# Patient Record
Sex: Female | Born: 2000 | Race: Black or African American | Hispanic: No | Marital: Married | Smoking: Never smoker
Health system: Southern US, Community
[De-identification: ages and names within clinical notes are randomized; demographics above are authoritative.]

## PROBLEM LIST (undated history)

## (undated) DIAGNOSIS — K219 Gastro-esophageal reflux disease without esophagitis: Secondary | ICD-10-CM

## (undated) HISTORY — DX: Gastro-esophageal reflux disease without esophagitis: K21.9

---
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
## 2020-11-24 ENCOUNTER — Ambulatory Visit (HOSPITAL_COMMUNITY)
Admission: EM | Admit: 2020-11-24 | Discharge: 2020-11-24 | Disposition: A | Discharge status: Home / Self Care | Payer: Self-pay | Attending: Family Medicine | Admitting: Family Medicine

## 2020-11-24 ENCOUNTER — Other Ambulatory Visit: Payer: Self-pay

## 2020-11-24 ENCOUNTER — Encounter (HOSPITAL_COMMUNITY): Payer: Self-pay | Admitting: Emergency Medicine

## 2020-11-24 DIAGNOSIS — R1084 Generalized abdominal pain: Secondary | ICD-10-CM

## 2020-11-24 LAB — POCT URINALYSIS DIPSTICK, ED / UC
Bilirubin Urine: NEGATIVE
Glucose, UA: NEGATIVE mg/dL
Ketones, ur: NEGATIVE mg/dL
Leukocytes,Ua: NEGATIVE
Nitrite: NEGATIVE
Protein, ur: 30 mg/dL — AB
Specific Gravity, Urine: 1.025 (ref 1.005–1.030)
Urobilinogen, UA: 0.2 mg/dL (ref 0.0–1.0)
pH: 6 (ref 5.0–8.0)

## 2020-11-24 LAB — POC URINE PREG, ED: Preg Test, Ur: NEGATIVE

## 2020-11-24 MED ORDER — PANTOPRAZOLE SODIUM 40 MG PO TBEC: 40.0000 mg | DELAYED_RELEASE_TABLET | Freq: Every day | ORAL | 0 refills | Status: DC

## 2020-11-24 NOTE — Discharge Instructions (Addendum)

## 2020-11-24 NOTE — ED Triage Notes (Signed)
Lower abdominal pain for 2 weeks.  Eating causes pain.  No vomiting, no diarrhea.  Complains of bloating and had BM on Thursday-soft stool.  Denies urinary symptoms.  Denies vaginal discharge

## 2020-11-26 NOTE — ED Provider Notes (Signed)
[name] CARE CENTER   [id number] 11/24/20 Arrival Time: 1226  ASSESSMENT & PLAN:  1. Generalized abdominal pain    Benign abdominal exam. Suspect GERD etiology. Possible mild constipation also. Given samples of Miralax. No indications for urgent abdominal/pelvic imaging at this time. Discussed.  Begin trial of: Meds ordered this encounter  Medications   pantoprazole (PROTONIX) 40 MG tablet    Sig: Take 1 tablet (40 mg total) by mouth daily.    Dispense:  30 tablet    Refill:  0     Discharge Instructions      You have been seen today for abdominal pain. Your evaluation was not suggestive of any emergent condition requiring medical intervention at this time. However, some abdominal problems make take more time to appear. Therefore, it is very important for you to pay attention to any new symptoms or worsening of your current condition.  Please return here or to the Emergency Department immediately should you begin to feel worse in any way or have any of the following symptoms: increasing or different abdominal pain, persistent vomiting, inability to drink fluids, fevers, or shaking chills.       Follow-up Information     [facility] EMERGENCY DEPARTMENT.   Specialty: Emergency Medicine Why: If symptoms worsen in any way. Contact information: [address] [id number] mc [place] [address] [phone]               Reviewed expectations re: course of current medical issues. Questions answered. Outlined signs and symptoms indicating need for more acute intervention. Patient verbalized understanding. After Visit Summary given.   SUBJECTIVE: History from: patient. Eileen Palmer is a 20 y.o. female who presents with complaint of mostly epigastric abdominal discomfort over past two weeks. Worse after eating. Normal PO intake without n/v/d. Symptoms do not limit daily activities nor wake her from sleep. Feels bloated at  times. More recently has noted lower abdominal discomfort but relates this to moving her bowel less frequently than usual. BMs all non-bloody. Normal urinary habits. No tx PTA. Patient's last menstrual period was 11/04/2020. Regular.  OBJECTIVE:  Vitals:   11/24/20 1356  BP: 127/79  Pulse: 71  Resp: 20  Temp: 98.5 F (36.9 C)  TempSrc: Oral  SpO2: 100%    General appearance: alert, oriented, no acute distress HEENT: [place]; AT; oropharynx moist Lungs: unlabored respirations Abdomen: soft; without distention; no specific tenderness to palpation; normal bowel sounds; without masses or organomegaly; without guarding or rebound tenderness Back: without reported CVA tenderness; FROM at waist Extremities: without LE edema; symmetrical; without gross deformities Skin: warm and dry Neurologic: normal gait Psychological: alert and cooperative; normal mood and affect  Labs: Results for orders placed or performed during the hospital encounter of 11/24/20  POC Urinalysis dipstick  Result Value Ref Range   Glucose, UA NEGATIVE NEGATIVE mg/dL   Bilirubin Urine NEGATIVE NEGATIVE   Ketones, ur NEGATIVE NEGATIVE mg/dL   Specific Gravity, Urine 1.025 1.005 - 1.030   Hgb urine dipstick TRACE (A) NEGATIVE   pH 6.0 5.0 - 8.0   Protein, ur 30 (A) NEGATIVE mg/dL   Urobilinogen, UA 0.2 0.0 - 1.0 mg/dL   Nitrite NEGATIVE NEGATIVE   Leukocytes,Ua NEGATIVE NEGATIVE  POC urine pregnancy  Result Value Ref Range   Preg Test, Ur NEGATIVE NEGATIVE   Labs Reviewed  POCT URINALYSIS DIPSTICK, ED / UC - Abnormal; Notable for the following components:      Result Value   Hgb urine dipstick  TRACE (*)    Protein, ur 30 (*)    All other components within normal limits  POC URINE PREG, ED    No Known Allergies                                             History reviewed. No pertinent past medical history.  Social History   Socioeconomic History   Marital status: Single    Spouse name: Not on file    Number of children: Not on file   Years of education: Not on file   Highest education level: Not on file  Occupational History   Not on file  Tobacco Use   Smoking status: Never   Smokeless tobacco: Never  Vaping Use   Vaping Use: Never used  Substance and Sexual Activity   Alcohol use: No   Drug use: Never   Sexual activity: Not on file  Other Topics Concern   Not on file  Social History Narrative   Not on file   Social Determinants of Health   Financial Resource Strain: Not on file  Food Insecurity: Not on file  Transportation Needs: Not on file  Physical Activity: Not on file  Stress: Not on file  Social Connections: Not on file  Intimate Partner Violence: Not on file    Family History  Problem Relation Age of Onset   Hyperlipidemia Mother    Diabetes Mother    Healthy Father      Mardella Layman, MD 11/26/20 1113

## 2021-05-29 ENCOUNTER — Ambulatory Visit (HOSPITAL_COMMUNITY)
Admission: EM | Admit: 2021-05-29 | Discharge: 2021-05-29 | Disposition: A | Discharge status: Home / Self Care | Payer: BC Managed Care – PPO | Attending: Physician Assistant | Admitting: Physician Assistant

## 2021-05-29 ENCOUNTER — Encounter (HOSPITAL_COMMUNITY): Payer: Self-pay

## 2021-05-29 ENCOUNTER — Other Ambulatory Visit: Payer: Self-pay

## 2021-05-29 ENCOUNTER — Ambulatory Visit (INDEPENDENT_AMBULATORY_CARE_PROVIDER_SITE_OTHER): Payer: BC Managed Care – PPO

## 2021-05-29 DIAGNOSIS — R051 Acute cough: Secondary | ICD-10-CM | POA: Insufficient documentation

## 2021-05-29 DIAGNOSIS — R059 Cough, unspecified: Secondary | ICD-10-CM

## 2021-05-29 DIAGNOSIS — K219 Gastro-esophageal reflux disease without esophagitis: Secondary | ICD-10-CM | POA: Diagnosis not present

## 2021-05-29 DIAGNOSIS — R1013 Epigastric pain: Secondary | ICD-10-CM | POA: Insufficient documentation

## 2021-05-29 LAB — CBC
HCT: 38.9 % (ref 36.0–46.0)
Hemoglobin: 12.6 g/dL (ref 12.0–15.0)
MCH: 28.8 pg (ref 26.0–34.0)
MCHC: 32.4 g/dL (ref 30.0–36.0)
MCV: 89 fL (ref 80.0–100.0)
Platelets: 273 10*3/uL (ref 150–400)
RBC: 4.37 MIL/uL (ref 3.87–5.11)
RDW: 12 % (ref 11.5–15.5)
WBC: 4.1 10*3/uL (ref 4.0–10.5)
nRBC: 0 % (ref 0.0–0.2)

## 2021-05-29 LAB — COMPREHENSIVE METABOLIC PANEL
ALT: 20 U/L (ref 0–44)
AST: 19 U/L (ref 15–41)
Albumin: 3.5 g/dL (ref 3.5–5.0)
Alkaline Phosphatase: 78 U/L (ref 38–126)
Anion gap: 7 (ref 5–15)
BUN: 6 mg/dL (ref 6–20)
CO2: 23 mmol/L (ref 22–32)
Calcium: 8.6 mg/dL — ABNORMAL LOW (ref 8.9–10.3)
Chloride: 105 mmol/L (ref 98–111)
Creatinine, Ser: 0.7 mg/dL (ref 0.44–1.00)
GFR, Estimated: >60 mL/min (ref >60)
Glucose, Bld: 80 mg/dL (ref 70–99)
Potassium: 4 mmol/L (ref 3.5–5.1)
Sodium: 135 mmol/L (ref 135–145)
Total Bilirubin: 0.2 mg/dL — ABNORMAL LOW (ref 0.3–1.2)
Total Protein: 6.6 g/dL (ref 6.5–8.1)

## 2021-05-29 LAB — LIPASE, BLOOD: Lipase: 33 U/L (ref 11–51)

## 2021-05-29 MED ORDER — SUCRALFATE 1 G PO TABS: 1.0000 g | ORAL_TABLET | Freq: Three times a day (TID) | ORAL | 0 refills | Status: DC

## 2021-05-29 MED ORDER — ALUM & MAG HYDROXIDE-SIMETH 200-200-20 MG/5ML PO SUSP
30.0000 mL | Freq: Once | ORAL | Status: AC
  Administered 2021-05-29: 30 mL via ORAL

## 2021-05-29 MED ORDER — PANTOPRAZOLE SODIUM 40 MG PO TBEC: 40.0000 mg | DELAYED_RELEASE_TABLET | Freq: Every day | ORAL | 0 refills | Status: DC

## 2021-05-29 MED ORDER — LIDOCAINE VISCOUS HCL 2 % MT SOLN
15.0000 mL | Freq: Once | OROMUCOSAL | Status: AC
  Administered 2021-05-29: 15 mL via ORAL

## 2021-05-29 MED ORDER — ALUM & MAG HYDROXIDE-SIMETH 200-200-20 MG/5ML PO SUSP
ORAL | Status: AC
  Filled 2021-05-29: qty 30

## 2021-05-29 MED ORDER — LIDOCAINE VISCOUS HCL 2 % MT SOLN
OROMUCOSAL | Status: AC
  Filled 2021-05-29: qty 15

## 2021-05-29 NOTE — ED Provider Notes (Signed)
[place]    CSN: [id number] Arrival date & time: 05/29/21  1608      History   Chief Complaint Chief Complaint  Patient presents with   Abdominal Pain   Cough   Sore Throat    Dry    HPI Eileen Palmer is a 21 y.o. female.   Patient presents today with a prolonged history of intermittent upper abdominal pain that has worsened recently.  She was seen by our clinic for similar symptoms August 2022 at which point she was started on Protonix which did not provide any relief of symptoms.  She reports during episodes pain is rated 10 on a 0-10 pain scale, localized to upper abdomen, described as burning, no alleviating factors identified.  Patient does report symptoms are worse after meals.  She has not tried over-the-counter medication for symptom management.  She has not seen a GI specialist in the past.  She rarely drinks alcohol and denies ongoing NSAID use.  She is confident that she is not pregnant as she recently had her menstrual cycle; LMP 05/08/2021.  She does report that recently she has had worsening cough with blood-streaked sputum.  She denies any fever, chest pain, shortness of breath, nasal congestion, other URI symptoms.  She denies any dysphagia, melena, hematochezia, frank hemoptysis, nausea/vomiting.   History reviewed. No pertinent past medical history.  There are no problems to display for this patient.   History reviewed. No pertinent surgical history.  OB History   No obstetric history on file.      Home Medications    Prior to Admission medications   Medication Sig Start Date End Date Taking? Authorizing Provider  sucralfate (CARAFATE) 1 g tablet Take 1 tablet (1 g total) by mouth 4 (four) times daily -  with meals and at bedtime. 05/29/21  Yes Dyland Panuco K, PA-C  pantoprazole (PROTONIX) 40 MG tablet Take 1 tablet (40 mg total) by mouth daily. 05/29/21   Ayssa Bentivegna, Derry Skill, PA-C    Family History Family History  Problem Relation Age of Onset    Hyperlipidemia Mother    Diabetes Mother    Healthy Father     Social History Social History   Tobacco Use   Smoking status: Never   Smokeless tobacco: Never  Vaping Use   Vaping Use: Never used  Substance Use Topics   Alcohol use: No   Drug use: Never     Allergies   Patient has no known allergies.   Review of Systems Review of Systems  Constitutional:  Positive for activity change. Negative for appetite change, fatigue and fever.  HENT:  Positive for sore throat. Negative for congestion, sinus pressure and sneezing.   Respiratory:  Positive for cough. Negative for shortness of breath.   Cardiovascular:  Negative for chest pain.  Gastrointestinal:  Positive for abdominal pain. Negative for diarrhea, nausea and vomiting.  Neurological:  Negative for dizziness, light-headedness and headaches.    Physical Exam Triage Vital Signs ED Triage Vitals  Enc Vitals Group     BP 05/29/21 1730 125/83     Pulse Rate 05/29/21 1729 69     Resp 05/29/21 1729 16     Temp 05/29/21 1730 98.4 F (36.9 C)     Temp Source 05/29/21 1730 Oral     SpO2 05/29/21 1729 99 %     Weight --      Height --      Head Circumference --      Peak  Flow --      Pain Score 05/29/21 1728 0     Pain Loc --      Pain Edu? --      Excl. in [place]? --    No data found.  Updated Vital Signs BP 125/83 (BP Location: Left Arm)    Pulse 69    Temp 98.4 F (36.9 C) (Oral)    Resp 16    LMP 05/08/2021 (Exact Date)    SpO2 99%   Visual Acuity Right Eye Distance:   Left Eye Distance:   Bilateral Distance:    Right Eye Near:   Left Eye Near:    Bilateral Near:     Physical Exam Vitals reviewed.  Constitutional:      General: She is awake. She is not in acute distress.    Appearance: Normal appearance. She is well-developed. She is not ill-appearing.     Comments: Very pleasant female appears stated age in no acute distress  HENT:     Head: Normocephalic and atraumatic.     Mouth/Throat:      Mouth: Mucous membranes are moist.     Pharynx: Uvula midline. No oropharyngeal exudate or posterior oropharyngeal erythema.  Cardiovascular:     Rate and Rhythm: Normal rate and regular rhythm.     Heart sounds: Normal heart sounds, S1 normal and S2 normal. No murmur heard. Pulmonary:     Effort: Pulmonary effort is normal.     Breath sounds: Normal breath sounds. No wheezing, rhonchi or rales.     Comments: Clear to auscultation bilaterally Abdominal:     General: Bowel sounds are normal.     Palpations: Abdomen is soft.     Tenderness: There is abdominal tenderness in the epigastric area and left upper quadrant. There is no right CVA tenderness, left CVA tenderness, guarding or rebound.  Musculoskeletal:     Right lower leg: No edema.     Left lower leg: No edema.  Psychiatric:        Behavior: Behavior is cooperative.     UC Treatments / Results  Labs (all labs ordered are listed, but only abnormal results are displayed) Labs Reviewed  CBC  COMPREHENSIVE METABOLIC PANEL  LIPASE, BLOOD    EKG   Radiology DG Chest 2 View  Result Date: 05/29/2021 CLINICAL DATA:  Worsening cough. EXAM: CHEST - 2 VIEW COMPARISON:  11/25/2017 FINDINGS: The heart size and mediastinal contours are within normal limits. Both lungs are clear. The visualized skeletal structures are unremarkable. IMPRESSION: No active cardiopulmonary disease. Electronically Signed   By: Yvonne Kendall M.D.   On: 05/29/2021 18:03    Procedures Procedures (including critical care time)  Medications Ordered in UC Medications  alum & mag hydroxide-simeth (MAALOX/MYLANTA) 200-200-20 MG/5ML suspension 30 mL (30 mLs Oral Given 05/29/21 1809)    And  lidocaine (XYLOCAINE) 2 % viscous mouth solution 15 mL (15 mLs Oral Given 05/29/21 1809)    Initial Impression / Assessment and Plan / UC Course  I have reviewed the triage vital signs and the nursing notes.  Pertinent labs & imaging results that were available during my  care of the patient were reviewed by me and considered in my medical decision making (see chart for details).     Vital signs and physical exam reassuring today; no indication for emergent evaluation or imaging.  X-ray was obtained given chronic cough which showed no active cardiopulmonary disease.  Discussed that sore throat and cough symptoms are likely related  to uncontrolled GERD.  Patient had epigastric pain on exam so lipase, CBC, CMP were obtained today-results pending.  She was given GI cocktail in clinic with improvement.  Given persistent symptoms concern for ulcer disease versus H. pylori.  Discussed that this would need to be investigated by GI specialist and she was given contact information for local provider with instruction to call to schedule an appointment as soon as possible.  She was started on Protonix and Carafate for symptom management.  Recommended dietary and lifestyle modification for additional symptom relief.  Discussed that if symptoms worsen in any way and she has severe abdominal pain, fever, nausea/vomiting, melena/hematochezia, hematemesis she needs to go to the emergency room.  Strict return precautions given to which she expressed understanding.  Work excuse note provided.  Final Clinical Impressions(s) / UC Diagnoses   Final diagnoses:  Abdominal pain, epigastric  Acute cough  Gastroesophageal reflux disease, unspecified whether esophagitis present     Discharge Instructions      I will contact you if your lab work is abnormal.  Please start Protonix as previously prescribed.  This should be taken on empty stomach entheses 30 minutes before meal or 2 hours after).  Take Carafate before each meal and before bed to coat your stomach.  Avoid alcohol.  Avoid NSAIDs including aspirin, ibuprofen/Advil, naproxen/Aleve.  If your symptoms not improving quickly please follow-up with stomach specialist.  If develop any severe symptoms including severe abdominal pain,  nausea/vomiting interfering with oral intake, blood in your stool, blood in your vomit you need to go to the emergency room.     ED Prescriptions     Medication Sig Dispense Auth. Provider   pantoprazole (PROTONIX) 40 MG tablet Take 1 tablet (40 mg total) by mouth daily. 30 tablet Escarlet Saathoff K, PA-C   sucralfate (CARAFATE) 1 g tablet Take 1 tablet (1 g total) by mouth 4 (four) times daily -  with meals and at bedtime. 28 tablet Sylvan Lahm, Derry Skill, PA-C      PDMP not reviewed this encounter.   Terrilee Croak, PA-C 05/29/21 1825

## 2021-05-29 NOTE — Discharge Instructions (Signed)
I will contact you if your lab work is abnormal.  Please start Protonix as previously prescribed.  This should be taken on empty stomach entheses 30 minutes before meal or 2 hours after).  Take Carafate before each meal and before bed to coat your stomach.  Avoid alcohol.  Avoid NSAIDs including aspirin, ibuprofen/Advil, naproxen/Aleve.  If your symptoms not improving quickly please follow-up with stomach specialist.  If develop any severe symptoms including severe abdominal pain, nausea/vomiting interfering with oral intake, blood in your stool, blood in your vomit you need to go to the emergency room.

## 2021-05-29 NOTE — ED Triage Notes (Signed)
Pt presents with c/o stomach pain x 3 days.

## 2021-05-30 ENCOUNTER — Encounter: Payer: Self-pay | Admitting: Nurse Practitioner

## 2021-06-11 ENCOUNTER — Encounter: Payer: Self-pay | Admitting: Nurse Practitioner

## 2021-06-11 ENCOUNTER — Ambulatory Visit (INDEPENDENT_AMBULATORY_CARE_PROVIDER_SITE_OTHER): Payer: BC Managed Care – PPO | Admitting: Nurse Practitioner

## 2021-06-11 ENCOUNTER — Other Ambulatory Visit: Payer: BC Managed Care – PPO

## 2021-06-11 VITALS — BP 102/68 | HR 62 | Ht 64.0 in | Wt 265.2 lb

## 2021-06-11 DIAGNOSIS — R1030 Lower abdominal pain, unspecified: Secondary | ICD-10-CM | POA: Diagnosis not present

## 2021-06-11 DIAGNOSIS — K5900 Constipation, unspecified: Secondary | ICD-10-CM | POA: Diagnosis not present

## 2021-06-11 DIAGNOSIS — R1013 Epigastric pain: Secondary | ICD-10-CM | POA: Insufficient documentation

## 2021-06-11 DIAGNOSIS — R1010 Upper abdominal pain, unspecified: Secondary | ICD-10-CM

## 2021-06-11 HISTORY — DX: Epigastric pain: R10.13

## 2021-06-11 HISTORY — DX: Constipation, unspecified: K59.00

## 2021-06-11 MED ORDER — FAMOTIDINE 20 MG PO TABS
20.0000 mg | ORAL_TABLET | Freq: Every day | ORAL | 1 refills | Status: DC
Start: 2021-06-11 — End: 2024-03-04

## 2021-06-11 NOTE — Patient Instructions (Addendum)
RECOMMENDATIONS:  Stop Protonix. Start Famotidine 20 MG once a day after you submit the stool tests. This has been sent to the pharmacy, if your insurance does not cover it, then you may purchase it over the counter. Miralax- Dissolve one capful in 8 ounces of water and drink before bed. We have scheduled you a follow up with Dr. Candis Schatz on 07/30/21 at 11:10am.  Please proceed to the basement level to pick up the stool test kit before leaving today. Press "B" on the elevator. The lab is located at the first door on the left as you exit the elevator.  HEALTHCARE LAWS AND MY CHART RESULTS:   Due to recent changes in healthcare laws, you may see results of your imaging and/or laboratory studies on MyChart before I have had a chance to review them.  I understand that in some cases there may be results that are confusing or concerning to you. Please understand that not all results are received at the same time and often I may need to interpret multiple results in order to provide you with the best plan of care or course of treatment. Therefore, I ask that you please give me 48 hours to thoroughly review all your results before contacting my office for clarification.   Gastroesophageal Reflux Disease, Adult Gastroesophageal reflux (GER) happens when acid from the stomach flows up into the tube that connects the mouth and the stomach (esophagus). Normally, food travels down the esophagus and stays in the stomach to be digested. With GER, food and stomach acid sometimes move back up into the esophagus. You may have a disease called gastroesophageal reflux disease (GERD) if the reflux: Happens often. Causes frequent or very bad symptoms. Causes problems such as damage to the esophagus. When this happens, the esophagus becomes sore and swollen. Over time, GERD can make small holes (ulcers) in the lining of the esophagus. What are the causes? This condition is caused by a problem with the muscle between  the esophagus and the stomach. When this muscle is weak or not normal, it does not close properly to keep food and acid from coming back up from the stomach. The muscle can be weak because of: Tobacco use. Pregnancy. Having a certain type of hernia (hiatal hernia). Alcohol use. Certain foods and drinks, such as coffee, chocolate, onions, and peppermint. What increases the risk? Being overweight. Having a disease that affects your connective tissue. Taking NSAIDs, such a ibuprofen. What are the signs or symptoms? Heartburn. Difficult or painful swallowing. The feeling of having a lump in the throat. A bitter taste in the mouth. Bad breath. Having a lot of saliva. Having an upset or bloated stomach. Burping. Chest pain. Different conditions can cause chest pain. Make sure you see your doctor if you have chest pain. Shortness of breath or wheezing. A long-term cough or a cough at night. Wearing away of the surface of teeth (tooth enamel). Weight loss. How is this treated? Making changes to your diet. Taking medicine. Having surgery. Treatment will depend on how bad your symptoms are. Follow these instructions at home: Eating and drinking  Follow a diet as told by your doctor. You may need to avoid foods and drinks such as: Coffee and tea, with or without caffeine. Drinks that contain alcohol. Energy drinks and sports drinks. Bubbly (carbonated) drinks or sodas. Chocolate and cocoa. Peppermint and mint flavorings. Garlic and onions. Horseradish. Spicy and acidic foods. These include peppers, chili powder, curry powder, vinegar, hot sauces, and BBQ  sauce. Citrus fruit juices and citrus fruits, such as oranges, lemons, and limes. Tomato-based foods. These include red sauce, chili, salsa, and pizza with red sauce. Fried and fatty foods. These include donuts, french fries, potato chips, and high-fat dressings. High-fat meats. These include hot dogs, rib eye steak, sausage, ham,  and bacon. High-fat dairy items, such as whole milk, butter, and cream cheese. Eat small meals often. Avoid eating large meals. Avoid drinking large amounts of liquid with your meals. Avoid eating meals during the 2-3 hours before bedtime. Avoid lying down right after you eat. Do not exercise right after you eat. Lifestyle  Do not smoke or use any products that contain nicotine or tobacco. If you need help quitting, ask your doctor. Try to lower your stress. If you need help doing this, ask your doctor. If you are overweight, lose an amount of weight that is healthy for you. Ask your doctor about a safe weight loss goal. General instructions Pay attention to any changes in your symptoms. Take over-the-counter and prescription medicines only as told by your doctor. Do not take aspirin, ibuprofen, or other NSAIDs unless your doctor says it is okay. Wear loose clothes. Do not wear anything tight around your waist. Raise (elevate) the head of your bed about 6 inches (15 cm). You may need to use a wedge to do this. Avoid bending over if this makes your symptoms worse. Keep all follow-up visits. Contact a doctor if: You have new symptoms. You lose weight and you do not know why. You have trouble swallowing or it hurts to swallow. You have wheezing or a cough that keeps happening. You have a hoarse voice. Your symptoms do not get better with treatment. Get help right away if: You have sudden pain in your arms, neck, jaw, teeth, or back. You suddenly feel sweaty, dizzy, or light-headed. You have chest pain or shortness of breath. You vomit and the vomit is green, yellow, or black, or it looks like blood or coffee grounds. You faint. Your poop (stool) is red, bloody, or black. You cannot swallow, drink, or eat. These symptoms may represent a serious problem that is an emergency. Do not wait to see if the symptoms will go away. Get medical help right away. Call your local emergency services  (911 in the [place] Do not drive yourself to the hospital. Summary If a person has gastroesophageal reflux disease (GERD), food and stomach acid move back up into the esophagus and cause symptoms or problems such as damage to the esophagus. Treatment will depend on how bad your symptoms are. Follow a diet as told by your doctor. Take all medicines only as told by your doctor. This information is not intended to replace advice given to you by your health care provider. Make sure you discuss any questions you have with your health care provider. Document Revised: 10/10/2019 Document Reviewed: 10/10/2019 Elsevier Patient Education  2022 [employer]. Thank you for trusting me with your gastrointestinal care!    Noralyn Pick, CRNP    BMI:  If you are age 74 or older, your body mass index should be between 23-30. Your Body mass index is 45.53 kg/m. If this is out of the aforementioned range listed, please consider follow up with your Primary Care Provider.  If you are age 74 or younger, your body mass index should be between 19-25. Your Body mass index is 45.53 kg/m. If this is out of the aformentioned range listed, please consider follow up with  your Primary Care Provider.   MY CHART:  The [facility] providers would like to encourage you to use [facility] to communicate with providers for non-urgent requests or questions.  Due to long hold times on the telephone, sending your provider a message by [facility] may be a faster and more efficient way to get a response.  Please allow 48 business hours for a response.  Please remember that this is for non-urgent requests.

## 2021-06-11 NOTE — Progress Notes (Signed)
Agree with the assessment and plan as outlined by Colleen Kennedy-Smith, NP.    Azariyah Luhrs E. Sherilynn Dieu, MD [facility]  

## 2021-06-11 NOTE — Progress Notes (Signed)
++    06/11/2021 Rilla Edman [id number] 02-25-2001   CHIEF COMPLAINT: Abdominal pain, constipation  HISTORY OF PRESENT ILLNESS:  Farhiya Vannatter is a 21 year old female with a past medical history of obesity. No past surgical history. She presents to our office today as referred by Verna Czech PA-C for further evaluation regarding intermittent upper and lower abdominal pain. She was initially seen in the urgent care clinic 11/24/2020 due to having upper abdominal pain and constipation for which she was prescribed Protonix 40mg  QD and MiraLAX as needed.  She was seen in the urgent care center 05/29/2021 with upper abdominal pain and a cough.  She reported coughing up mucus with streaks of red blood.  A chest x-ray was negative.  She received a GI cocktail and her symptoms improved.  Her cough and throat soreness were thought to be due to GERD.  Her labs were normal and she was advised to continue Pantoprazole 40 mg daily, Sucralfate 1 g 4 times daily was prescribed and she was referred to our office for further GI evaluation. She describes having mostly lower abdominal pain which occurs after eating, no specific foods. She has upper abdominal pain less frequently. She has intermittent nausea without vomiting. No dysphagia or heartburn.  Her throat soreness abated.  She stopped taking Pantoprazole one week ago because she felt sleep and dizzy after taking it.  She is not taking Carafate.  At this time, she is mostly concerned regarding her constipation issues with associated abdominal bloat.  She reports passing 2-3 small brown formed stools daily.  She does not feel emptied after defecation.  No rectal bleeding or black stools.  She is lactose intolerance therefore she avoids all dairy products.  Infrequent NSAID use.  No alcohol or drug use.   CBC Latest Ref Rng & Units 05/29/2021 01/01/2018  WBC 4.0 - 10.5 K/uL 4.1 3.6  Hemoglobin 12.0 - 15.0 g/dL 12.6 12.9  Hematocrit 36.0 - 46.0 % 38.9 39.4   Platelets 150 - 400 K/uL 273 337    CMP Latest Ref Rng & Units 05/29/2021 01/01/2018  Glucose 70 - 99 mg/dL 80 80  BUN 6 - 20 mg/dL 6 6  Creatinine 0.44 - 1.00 mg/dL 0.70 0.73  Sodium 135 - 145 mmol/L 135 138  Potassium 3.5 - 5.1 mmol/L 4.0 4.5  Chloride 98 - 111 mmol/L 105 100  CO2 22 - 32 mmol/L 23 23  Calcium 8.9 - 10.3 mg/dL 8.6(L) 9.8  Total Protein 6.5 - 8.1 g/dL 6.6 -  Total Bilirubin 0.3 - 1.2 mg/dL 0.2(L) -  Alkaline Phos 38 - 126 U/L 78 -  AST 15 - 41 U/L 19 -  ALT 0 - 44 U/L 20 -  Lipase 33.  Social History: Non-smoker.  No alcohol use.  No drug use.  Family History: Mother with history of diabetes and hyperlipidemia.  Father is healthy. No family history of esophageal, gastric or colon cancer.    Outpatient Encounter Medications as of 06/11/2021  Medication Sig   pantoprazole (PROTONIX) 40 MG tablet Take 1 tablet (40 mg total) by mouth daily.   sucralfate (CARAFATE) 1 g tablet Take 1 tablet (1 g total) by mouth 4 (four) times daily -  with meals and at bedtime.   No facility-administered encounter medications on file as of 06/11/2021.    REVIEW OF SYSTEMS: Gen: + Fatigue. Denies fever, sweats or chills. No weight loss.  CV: Denies chest pain, palpitations or edema. Resp: Denies cough, shortness of breath  of hemoptysis.  GI:See HPI.   GU : Denies urinary burning, blood in urine, increased urinary frequency or incontinence. MS: + Back pain.  Derm: Denies rash, itchiness, skin lesions or unhealing ulcers. Psych: Denies depression, anxiety, memory loss, suicidal ideation and confusion. Heme: Denies bruising, easy bleeding. Neuro:  Denies headaches, dizziness or paresthesias. Endo:  Denies any problems with DM, thyroid or adrenal function.  PHYSICAL EXAM: BP 102/68 (BP Location: Left Arm, Patient Position: Sitting, Cuff Size: Normal)    Pulse 62    Ht 5\' 4"  (1.626 m)    Wt 265 lb 4 oz (120.3 kg)    SpO2 98%    BMI 45.53 kg/m   General: 21 year old obese female in  no acute distress. Head: Normocephalic and atraumatic. Eyes:  Sclerae non-icteric, conjunctive pink. Ears: Normal auditory acuity. Mouth: Dentition intact. No ulcers or lesions.  Neck: Supple, no lymphadenopathy or thyromegaly.  Lungs: Clear bilaterally to auscultation without wheezes, crackles or rhonchi. Heart: Regular rate and rhythm. No murmur, rub or gallop appreciated.  Abdomen: Soft, nontender, non distended. No masses. No hepatosplenomegaly. Normoactive bowel sounds x 4 quadrants.  Rectal: Deferred. Musculoskeletal: Symmetrical with no gross deformities. Skin: Warm and dry. No rash or lesions on visible extremities. Extremities: No edema. Neurological: Alert oriented x 4, no focal deficits.  Psychological:  Alert and cooperative. Normal mood and affect.  ASSESSMENT AND PLAN:  31) 21 year old female with intermittent epigastric pain.  No epigastric pain at this time. -H. Pylori stool antigen (patient has been off PPI for 1 week) -RUQ sonogram if epigastric pain recurs -Famotidine 20 mg daily -GERD diet handout  -Eventual EGD if symptoms persist/worsen  2) Intermittent lower abdominal pain -Dicyclomine 10 mg 1 p.o. 3 times daily as needed, patient advised not to take 3 times daily every day as this medication could worsen her constipation -CTAP if abdominal pains worsen -No plans for colonoscopy at this time  3) Constipation with associated abdominal bloat  -Miralax Q HS -Consider trial with Linzess if no improvement on MiraLAX  Patient to follow-up in the office with Dr. Candis Schatz in 4 to 6 weeks and he can verify at that time if it is appropriate to pursue any endoscopic evaluation.      CC:  No ref. provider found

## 2021-06-13 LAB — H. PYLORI ANTIGEN, STOOL: H pylori Ag, Stl: NEGATIVE

## 2021-07-30 ENCOUNTER — Ambulatory Visit: Payer: BC Managed Care – PPO | Admitting: Gastroenterology

## 2021-07-31 ENCOUNTER — Ambulatory Visit: Payer: BC Managed Care – PPO | Admitting: Gastroenterology

## 2022-06-02 ENCOUNTER — Encounter (HOSPITAL_COMMUNITY): Payer: Self-pay | Admitting: *Deleted

## 2022-06-02 ENCOUNTER — Ambulatory Visit (INDEPENDENT_AMBULATORY_CARE_PROVIDER_SITE_OTHER): Payer: 59

## 2022-06-02 ENCOUNTER — Ambulatory Visit (HOSPITAL_COMMUNITY)
Admission: EM | Admit: 2022-06-02 | Discharge: 2022-06-02 | Disposition: A | Discharge status: Home / Self Care | Payer: 59 | Attending: Family Medicine | Admitting: Family Medicine

## 2022-06-02 ENCOUNTER — Other Ambulatory Visit: Payer: Self-pay

## 2022-06-02 DIAGNOSIS — W130XXA Fall from, out of or through balcony, initial encounter: Secondary | ICD-10-CM | POA: Diagnosis not present

## 2022-06-02 DIAGNOSIS — M25562 Pain in left knee: Secondary | ICD-10-CM

## 2022-06-02 MED ORDER — IBUPROFEN 800 MG PO TABS: 800.0000 mg | ORAL_TABLET | Freq: Three times a day (TID) | ORAL | 0 refills | Status: DC | PRN

## 2022-06-02 NOTE — Discharge Instructions (Signed)
The x-ray did not show any broken bones  Take ibuprofen 800 mg--1 tab every 8 hours as needed for pain.  Ice and rest your leg for the next day or 2.

## 2022-06-02 NOTE — ED Provider Notes (Signed)
[place]    CSN: [id number] Arrival date & time: 06/02/22  1028      History   Chief Complaint Chief Complaint  Patient presents with   Leg Pain    HPI Eileen Palmer is a 22 y.o. female.    Leg Pain  Here for left leg pain and knee pain.  Yesterday evening she stepped onto an old porch and stepped through a rotten board.  She fell through and stopped following through at her mid left thigh.  The opening in the wood was smaller than her leg.  Now her left knee hurts and her lower thigh hurts.  It is difficult to bend her left knee.  Last menstrual cycle was mid January, but she states it is usually irregular  History reviewed. No pertinent past medical history.  Patient Active Problem List   Diagnosis Date Noted   Constipation 06/11/2021   Abdominal pain, epigastric 06/11/2021    History reviewed. No pertinent surgical history.  OB History   No obstetric history on file.      Home Medications    Prior to Admission medications   Medication Sig Start Date End Date Taking? Authorizing Provider  ibuprofen (ADVIL) 800 MG tablet Take 1 tablet (800 mg total) by mouth every 8 (eight) hours as needed (pain). 06/02/22  Yes Barrett Henle, MD  famotidine (PEPCID) 20 MG tablet Take 1 tablet (20 mg total) by mouth daily. 06/11/21   Noralyn Pick, NP  pantoprazole (PROTONIX) 40 MG tablet Take 1 tablet (40 mg total) by mouth daily. 05/29/21   Raspet, Derry Skill, PA-C  sucralfate (CARAFATE) 1 g tablet Take 1 tablet (1 g total) by mouth 4 (four) times daily -  with meals and at bedtime. 05/29/21   Raspet, Derry Skill, PA-C    Family History Family History  Problem Relation Age of Onset   Hyperlipidemia Mother    Diabetes Mother    Healthy Father     Social History Social History   Tobacco Use   Smoking status: Never   Smokeless tobacco: Never  Vaping Use   Vaping Use: Never used  Substance Use Topics   Alcohol use: Not Currently    Comment:  occasionally   Drug use: Never     Allergies   Patient has no known allergies.   Review of Systems Review of Systems   Physical Exam Triage Vital Signs ED Triage Vitals  Enc Vitals Group     BP 06/02/22 1242 (!) 141/84     Pulse Rate 06/02/22 1242 68     Resp 06/02/22 1242 18     Temp 06/02/22 1242 98.3 F (36.8 C)     Temp src --      SpO2 06/02/22 1242 98 %     Weight --      Height --      Head Circumference --      Peak Flow --      Pain Score 06/02/22 1237 8     Pain Loc --      Pain Edu? --      Excl. in [place]? --    No data found.  Updated Vital Signs BP (!) 141/84   Pulse 68   Temp 98.3 F (36.8 C)   Resp 18   LMP 04/27/2022   SpO2 98%   Visual Acuity Right Eye Distance:   Left Eye Distance:   Bilateral Distance:    Right Eye Near:   Left  Eye Near:    Bilateral Near:     Physical Exam Vitals reviewed.  Constitutional:      General: She is not in acute distress.    Appearance: She is not ill-appearing, toxic-appearing or diaphoretic.  Musculoskeletal:     Comments: Tenderness of the anterior left knee.  No obvious ecchymosis noted at this time.  There is no swelling of the left ankle and there is good range of motion at the left ankle.  Skin:    Capillary Refill: Capillary refill takes less than 2 seconds.     Coloration: Skin is not jaundiced or pale.  Neurological:     General: No focal deficit present.     Mental Status: She is alert and oriented to person, place, and time.      UC Treatments / Results  Labs (all labs ordered are listed, but only abnormal results are displayed) Labs Reviewed - No data to display  EKG   Radiology DG Knee AP/LAT W/Sunrise Left  Result Date: 06/02/2022 CLINICAL DATA:  Left knee pain and swelling after falling through a wooden porch. EXAM: LEFT KNEE 3 VIEWS COMPARISON:  None Available. FINDINGS: No acute osseous or joint abnormality.  No radiopaque foreign body. IMPRESSION: Negative. Electronically  Signed   By: Lorin Picket M.D.   On: 06/02/2022 13:29    Procedures Procedures (including critical care time)  Medications Ordered in UC Medications - No data to display  Initial Impression / Assessment and Plan / UC Course  I have reviewed the triage vital signs and the nursing notes.  Pertinent labs & imaging results that were available during my care of the patient were reviewed by me and considered in my medical decision making (see chart for details).         X-ray does not show any fractures.  Suspect she at least has some significant bruising. Ibuprofen is sent in for pain relief, and ACE is applied to the knee. Contact info for ortho given. Final Clinical Impressions(s) / UC Diagnoses   Final diagnoses:  Acute pain of left knee     Discharge Instructions      The x-ray did not show any broken bones  Take ibuprofen 800 mg--1 tab every 8 hours as needed for pain.  Ice and rest your leg for the next day or 2.     ED Prescriptions     Medication Sig Dispense Auth. Provider   ibuprofen (ADVIL) 800 MG tablet Take 1 tablet (800 mg total) by mouth every 8 (eight) hours as needed (pain). 21 tablet Calin Fantroy, Gwenlyn Perking, MD      PDMP not reviewed this encounter.   Barrett Henle, MD 06/02/22 [phone]

## 2022-06-02 NOTE — ED Triage Notes (Signed)
Pt reports yesterday she fell thru a porch floor that had rotted wood. Pt reports he leg went thru up to her thigh.

## 2022-06-13 ENCOUNTER — Ambulatory Visit (INDEPENDENT_AMBULATORY_CARE_PROVIDER_SITE_OTHER): Payer: 59 | Admitting: Orthopaedic Surgery

## 2022-06-13 ENCOUNTER — Other Ambulatory Visit (HOSPITAL_BASED_OUTPATIENT_CLINIC_OR_DEPARTMENT_OTHER): Payer: Self-pay

## 2022-06-13 DIAGNOSIS — S7010XA Contusion of unspecified thigh, initial encounter: Secondary | ICD-10-CM | POA: Diagnosis not present

## 2022-06-13 MED ORDER — MELOXICAM 15 MG PO TABS
15.0000 mg | ORAL_TABLET | Freq: Every day | ORAL | 0 refills | Status: DC
  Filled 2022-06-13: qty 14, 14d supply, fill #0

## 2022-06-13 NOTE — Progress Notes (Signed)
Chief Complaint: Left knee, thigh pain     History of Present Illness:    Eileen Palmer is a 22 y.o. female presents today with left predominantly thigh pain after an injury where she fell through her deck that had a loose plank.  She is since that time been experiencing bruising and pain about the thigh.  Initially could not walk although her gait has been able to normalize since then.  She is still having some mild pain.      Surgical History:   none  PMH/PSH/Family History/Social History/Meds/Allergies:   No past medical history on file. No past surgical history on file. Social History   Socioeconomic History   Marital status: Single    Spouse name: Not on file   Number of children: Not on file   Years of education: Not on file   Highest education level: Not on file  Occupational History   Not on file  Tobacco Use   Smoking status: Never   Smokeless tobacco: Never  Vaping Use   Vaping Use: Never used  Substance and Sexual Activity   Alcohol use: Not Currently    Comment: occasionally   Drug use: Never   Sexual activity: Not on file  Other Topics Concern   Not on file  Social History Narrative   Not on file   Social Determinants of Health   Financial Resource Strain: Not on file  Food Insecurity: Not on file  Transportation Needs: Not on file  Physical Activity: Sufficiently Active (01/01/2018)   Exercise Vital Sign    Days of Exercise per Week: 3 days    Minutes of Exercise per Session: 60 min  Stress: No Stress Concern Present (01/01/2018)   [place]    Feeling of Stress : Not at all  Social Connections: Not on file   Family History  Problem Relation Age of Onset   Hyperlipidemia Mother    Diabetes Mother    Healthy Father    No Known Allergies Current Outpatient Medications  Medication Sig Dispense Refill   meloxicam (MOBIC) 15 MG tablet Take 1  tablet (15 mg total) by mouth daily. 14 tablet 0   famotidine (PEPCID) 20 MG tablet Take 1 tablet (20 mg total) by mouth daily. 30 tablet 1   ibuprofen (ADVIL) 800 MG tablet Take 1 tablet (800 mg total) by mouth every 8 (eight) hours as needed (pain). 21 tablet 0   pantoprazole (PROTONIX) 40 MG tablet Take 1 tablet (40 mg total) by mouth daily. 30 tablet 0   sucralfate (CARAFATE) 1 g tablet Take 1 tablet (1 g total) by mouth 4 (four) times daily -  with meals and at bedtime. 28 tablet 0   No current facility-administered medications for this visit.   No results found.  Review of Systems:   A ROS was performed including pertinent positives and negatives as documented in the HPI.  Physical Exam :   Constitutional: NAD and appears stated age Neurological: Alert and oriented Psych: Appropriate affect and cooperative Last menstrual period 04/27/2022.   Comprehensive Musculoskeletal Exam:    Tenderness about the left thigh proximal to the knee.  Full range of motion about the left knee.  This is painless about the knee.  Distal neurosensory exam is intact  Imaging:   Xray (4 views left knee): Normal   I personally reviewed and interpreted the radiographs.   Assessment:   22 y.o. female with left thigh pain consistent with a contusion after falling through a deck.  At this time I would recommend a 2-week course of Mobic to help with her quad contusion.  I will plan to see her back as needed.  A work note for 1 additional week for rest in order to normalize her gait and pain.  Plan :    -Return to clinic as needed     I personally saw and evaluated the patient, and participated in the management and treatment plan.  Vanetta Mulders, MD Attending Physician, Orthopedic Surgery  This document was dictated using Dragon voice recognition software. A reasonable attempt at proof reading has been made to minimize errors.

## 2022-06-23 ENCOUNTER — Telehealth: Payer: Self-pay | Admitting: Orthopaedic Surgery

## 2022-06-23 NOTE — Telephone Encounter (Signed)
Patient need a note stating she can return to work today. She had a note stating she could return on the 7th but she was off and her employer states it needs to say today 06/23/2022

## 2022-06-23 NOTE — Telephone Encounter (Signed)
Work note uploaded to [employer]

## 2022-07-09 ENCOUNTER — Telehealth: Payer: Self-pay

## 2022-07-09 NOTE — Telephone Encounter (Signed)
Patient called and scheduled a follow up for 08-22-2022 with Jerelyn Scott.  She is requesting a sooner appointment. She has for the last month developed nausea and vomiting after meals.. She states that last night she saw blood in her vomit. She is hoarse and has some epigastric pain.

## 2022-07-10 NOTE — Telephone Encounter (Signed)
Pt states that she has a bad cough, her voice is gone, having episodes of emesis for several weeks. Last emesis was two days ago. Pt stated it was only a scant amount. Pt stated that she had an appointment to see a new PCP on 09/23/2022:  Pt previously scheduled to see Carl Best NP on 08/22/2022 at 10:00. Pt was offered a sooner appointment on 08/07/2022 at 1:30 but pt stated that she will just keep her original appointment. Pt was recommended to go to the urgent care if her symptoms persist and if she notices any more blood. Pt verbalized understanding with all questions answered.

## 2022-07-10 NOTE — Telephone Encounter (Signed)
Left message for pt to call back  °

## 2022-08-22 ENCOUNTER — Other Ambulatory Visit (INDEPENDENT_AMBULATORY_CARE_PROVIDER_SITE_OTHER): Payer: 59

## 2022-08-22 ENCOUNTER — Ambulatory Visit: Payer: 59 | Admitting: Nurse Practitioner

## 2022-08-22 ENCOUNTER — Encounter: Payer: Self-pay | Admitting: Nurse Practitioner

## 2022-08-22 VITALS — BP 118/78 | HR 69 | Wt 266.0 lb

## 2022-08-22 DIAGNOSIS — K5900 Constipation, unspecified: Secondary | ICD-10-CM

## 2022-08-22 DIAGNOSIS — R1013 Epigastric pain: Secondary | ICD-10-CM

## 2022-08-22 DIAGNOSIS — R1030 Lower abdominal pain, unspecified: Secondary | ICD-10-CM

## 2022-08-22 DIAGNOSIS — R112 Nausea with vomiting, unspecified: Secondary | ICD-10-CM | POA: Diagnosis not present

## 2022-08-22 LAB — CBC WITH DIFFERENTIAL/PLATELET
Basophils Absolute: 0 10*3/uL (ref 0.0–0.1)
Basophils Relative: 0.6 % (ref 0.0–3.0)
Eosinophils Absolute: 0.1 10*3/uL (ref 0.0–0.7)
Eosinophils Relative: 2.3 % (ref 0.0–5.0)
HCT: 37.8 % (ref 36.0–46.0)
Hemoglobin: 12.9 g/dL (ref 12.0–15.0)
Lymphocytes Relative: 50.3 % — ABNORMAL HIGH (ref 12.0–46.0)
Lymphs Abs: 1.9 10*3/uL (ref 0.7–4.0)
MCHC: 34 g/dL (ref 30.0–36.0)
MCV: 87.1 fl (ref 78.0–100.0)
Monocytes Absolute: 0.4 10*3/uL (ref 0.1–1.0)
Monocytes Relative: 11.1 % (ref 3.0–12.0)
Neutro Abs: 1.3 10*3/uL — ABNORMAL LOW (ref 1.4–7.7)
Neutrophils Relative %: 35.7 % — ABNORMAL LOW (ref 43.0–77.0)
Platelets: 298 10*3/uL (ref 150.0–400.0)
RBC: 4.34 Mil/uL (ref 3.87–5.11)
RDW: 13.1 % (ref 11.5–15.5)
WBC: 3.7 10*3/uL — ABNORMAL LOW (ref 4.0–10.5)

## 2022-08-22 LAB — COMPREHENSIVE METABOLIC PANEL
ALT: 15 U/L (ref 0–35)
AST: 13 U/L (ref 0–37)
Albumin: 4.1 g/dL (ref 3.5–5.2)
Alkaline Phosphatase: 92 U/L (ref 39–117)
BUN: 12 mg/dL (ref 6–23)
CO2: 27 mEq/L (ref 19–32)
Calcium: 9.5 mg/dL (ref 8.4–10.5)
Chloride: 106 mEq/L (ref 96–112)
Creatinine, Ser: 0.73 mg/dL (ref 0.40–1.20)
GFR: 115.97 mL/min (ref >60.00)
Glucose, Bld: 90 mg/dL (ref 70–99)
Potassium: 5 mEq/L (ref 3.5–5.1)
Sodium: 139 mEq/L (ref 135–145)
Total Bilirubin: 0.5 mg/dL (ref 0.2–1.2)
Total Protein: 7.1 g/dL (ref 6.0–8.3)

## 2022-08-22 MED ORDER — FAMOTIDINE 20 MG PO TABS: 20.0000 mg | ORAL_TABLET | Freq: Two times a day (BID) | ORAL | 2 refills | Status: DC

## 2022-08-22 NOTE — Progress Notes (Signed)
08/22/2022 Eileen Palmer [id number] [birth year]   Chief Complaint: Acid reflux   History of Present Illness: Eileen Palmer is a 22 year old female with a past medical history of obesity.  I initially saw Eileen Palmer in office 06/11/2021 due to having upper and lower abdominal pain and constipation.  At that time, she also noted having a productive cough with a few streaks of blood for which she was evaluated at urgent care treated with PPI and sucralfate.  H. pylori stool antigen was negative.  She stopped taking Pantoprazole because it caused dizziness.  She was prescribed Famotidine 20 mg daily which she took for few weeks and her symptoms improved.  MiraLAX was prescribed for constipation and dicyclomine for lower abdominal pain and her symptoms and proved.  She presents her office today with the same symptoms.  She had recurrence of a productive cough February or March 2024, she described coughing up mucus with streaks of blood which lasted for 1 week then abated.  Since then, she endorses having active acid reflux.  She continues to have variable upper and lower abdominal pain.  She describes epigastric pain as burning and makes her feel tired.  Eating sometimes triggers her upper abdominal pain and other times does not.  No specific food triggers.  She passes 1-2 dark brown bowel movements daily.  She does not feel emptied.  She sometimes feels bloated.   Current Outpatient Medications on File Prior to Visit  Medication Sig Dispense Refill   famotidine (PEPCID) 20 MG tablet Take 1 tablet (20 mg total) by mouth daily. 30 tablet 1   meloxicam (MOBIC) 15 MG tablet Take 1 tablet (15 mg total) by mouth daily. 14 tablet 0   pantoprazole (PROTONIX) 40 MG tablet Take 1 tablet (40 mg total) by mouth daily. 30 tablet 0   sucralfate (CARAFATE) 1 g tablet Take 1 tablet (1 g total) by mouth 4 (four) times daily -  with meals and at bedtime. 28 tablet 0   No current facility-administered medications on file  prior to visit.   No Known Allergies  Current Medications, Allergies, Past Medical History, Past Surgical History, Family History and Social History were reviewed in [employer] record.  Review of Systems:   Constitutional: Negative for fever, sweats, chills or weight loss.  Respiratory: Negative for shortness of breath.   Cardiovascular: Negative for chest pain, palpitations and leg swelling.  Gastrointestinal: See HPI.  Musculoskeletal: Negative for back pain or muscle aches.  Neurological: Negative for dizziness, headaches or paresthesias.    Physical Exam: BP 118/78   Pulse 69   Wt 266 lb (120.7 kg)   BMI 45.66 kg/m  LMP 4/27. She does not utilize birth control.  General: 22 year old obese female in no acute distress. Head: Normocephalic and atraumatic. Eyes: No scleral icterus. Conjunctiva pink . Ears: Normal auditory acuity. Mouth: Dentition intact. No ulcers or lesions.  Lungs: Clear throughout to auscultation. Heart: Regular rate and rhythm, no murmur. Abdomen: Soft, nontender and nondistended. No masses or hepatomegaly. Normal bowel sounds x 4 quadrants.  Rectal: Deferred.  Musculoskeletal: Symmetrical with no gross deformities. Extremities: No edema. Neurological: Alert oriented x 4. No focal deficits.  Psychological: Alert and cooperative. Normal mood and affect  Assessment and Recommendations:  22 year old female with intermittent acid reflux, possible LPR component and epigastric pain for the past  1 to 1/2 years.  -CBC, CMP -RUE sono to evaluate the gallbladder -EGD benefits and risks discussed including risk with  sedation, risk of bleeding, perforation and infection  -Famotidine 40 mg p.o. twice daily -Avoid NSAIDS/Meloxicam -Patient to contact our office if menstrual cycle delayed prior to EGD date  Intermittent lower abdominal pain, constipation likely contributing factor -MiraLAX nightly -IBgard 1 p.o. twice daily D as  needed  Cough, reported sputum with few streaks of blood.  No current cough. -Follow-up with PCP

## 2022-08-22 NOTE — Patient Instructions (Signed)
You have been scheduled for an endoscopy. Please follow written instructions given to you at your visit today. If you use inhalers (even only as needed), please bring them with you on the day of your procedure.  You have been scheduled for an abdominal ultrasound at [facility] (1st floor of hospital) on 08/29/22 at 10 am. Please arrive 30 minutes prior to your appointment for registration. Make certain not to have anything to eat or drink 6 hours prior to your appointment. Should you need to reschedule your appointment, please contact radiology at [phone]. This test typically takes about 30 minutes to perform.   IbGuard- 1 by mouth twice daily  Contact our office 1 week prior to your upper endoscopy if menstrual cycle is delayed.  Due to recent changes in healthcare laws, you may see the results of your imaging and laboratory studies on MyChart before your provider has had a chance to review them.  We understand that in some cases there may be results that are confusing or concerning to you. Not all laboratory results come back in the same time frame and the provider may be waiting for multiple results in order to interpret others.  Please give [place] 48 hours in order for your provider to thoroughly review all the results before contacting the office for clarification of your results.    Thank you for trusting me with your gastrointestinal care!   Alcide Evener, CRNP

## 2022-08-25 NOTE — Progress Notes (Signed)
Agree with the assessment and plan as outlined by Colleen Kennedy-Smith, NP.    Stokely Jeancharles E. Khylen Riolo, MD [facility]  

## 2022-08-29 ENCOUNTER — Ambulatory Visit (HOSPITAL_COMMUNITY)
Admission: RE | Admit: 2022-08-29 | Discharge: 2022-08-29 | Disposition: A | Discharge status: Home / Self Care | Payer: 59 | Source: Ambulatory Visit | Attending: Nurse Practitioner | Admitting: Nurse Practitioner

## 2022-08-29 DIAGNOSIS — R1013 Epigastric pain: Secondary | ICD-10-CM | POA: Insufficient documentation

## 2022-08-29 DIAGNOSIS — R1030 Lower abdominal pain, unspecified: Secondary | ICD-10-CM | POA: Diagnosis present

## 2022-08-29 DIAGNOSIS — R112 Nausea with vomiting, unspecified: Secondary | ICD-10-CM | POA: Diagnosis present

## 2022-08-29 DIAGNOSIS — K5900 Constipation, unspecified: Secondary | ICD-10-CM

## 2022-09-23 ENCOUNTER — Ambulatory Visit: Payer: 59 | Attending: Internal Medicine | Admitting: Internal Medicine

## 2022-09-23 ENCOUNTER — Encounter: Payer: Self-pay | Admitting: Internal Medicine

## 2022-09-23 VITALS — BP 122/76 | HR 60 | Temp 97.8°F | Ht 64.0 in | Wt 264.0 lb

## 2022-09-23 DIAGNOSIS — N926 Irregular menstruation, unspecified: Secondary | ICD-10-CM

## 2022-09-23 DIAGNOSIS — Z7689 Persons encountering health services in other specified circumstances: Secondary | ICD-10-CM

## 2022-09-23 DIAGNOSIS — L680 Hirsutism: Secondary | ICD-10-CM | POA: Diagnosis not present

## 2022-09-23 DIAGNOSIS — D72819 Decreased white blood cell count, unspecified: Secondary | ICD-10-CM

## 2022-09-23 NOTE — Progress Notes (Signed)
Patient ID: Eileen Palmer, female    DOB: 09-13-2000  MRN: [id number]  CC: Establish Care (New pt / est care. /Pt informed of low white blood cells would like advice on how to elevate WBC/Yes to pap at another appt. Instructed to sign ROI. )   Subjective: Eileen Palmer is a 22 y.o. female who presents for new pt visit.  Pt from [place]. Her concerns today include:  Pt with hx of GERD and constipation  Has been seeing GI for constipation and GERD.  She is on famotidine.  They recently did some blood tests and showed WBC to be mildly low at 3.7 with decreased in neutrophils and slight increase in lymphocytes.  She denies any fever or shortness of breath.  Reports that over the past few months she has had intermittent episodes of cough with blood-tinged mucus but that has since resolved.  Complains of irregular menses.  Menses started at the age of 60.  Menses were regular until about the age of 23 and then became more regular.  Now she sometimes may not have a menstrual cycle 1-2x/yr.  Other times menses may start late or come up to 5 days early.  Endorses some hirsutism under the chin that started about a year ago.  Some hair on the breast as well.  Mother also has hair under the chin.  No deepening of the voice.     Patient Active Problem List   Diagnosis Date Noted   Constipation 06/11/2021   Abdominal pain, epigastric 06/11/2021     Current Outpatient Medications on File Prior to Visit  Medication Sig Dispense Refill   famotidine (PEPCID) 20 MG tablet Take 1 tablet (20 mg total) by mouth daily. 30 tablet 1   famotidine (PEPCID) 20 MG tablet Take 1 tablet (20 mg total) by mouth 2 (two) times daily. 60 tablet 2   No current facility-administered medications on file prior to visit.    Allergies  Allergen Reactions   Lactose Intolerance (Gi) Diarrhea and Nausea Only    Social History   Socioeconomic History   Marital status: Single    Spouse name: Not on file   Number of  children: 0   Years of education: Not on file   Highest education level: Associate degree: academic program  Occupational History   Occupation: works in a Surveyor, quantity  Tobacco Use   Smoking status: Never   Smokeless tobacco: Never  Vaping Use   Vaping Use: Never used  Substance and Sexual Activity   Alcohol use: Not Currently    Comment: occasionally   Drug use: Never   Sexual activity: Not on file  Other Topics Concern   Not on file  Social History Narrative   Not on file   Social Determinants of Health   Financial Resource Strain: Not on file  Food Insecurity: Not on file  Transportation Needs: Not on file  Physical Activity: Sufficiently Active (01/01/2018)   Exercise Vital Sign    Days of Exercise per Week: 3 days    Minutes of Exercise per Session: 60 min  Stress: No Stress Concern Present (01/01/2018)   [employer] of Occupational Health - Occupational Stress Questionnaire    Feeling of Stress : Not at all  Social Connections: Not on file  Intimate Partner Violence: Not on file    Family History  Problem Relation Age of Onset   Hyperlipidemia Mother    Diabetes Mother    Healthy Father  History reviewed. No pertinent surgical history.  ROS: Review of Systems Negative except as stated above  PHYSICAL EXAM: BP 122/76 (BP Location: Left Arm, Patient Position: Sitting, Cuff Size: Normal)   Pulse 60   Temp 97.8 F (36.6 C) (Oral)   Ht 5\' 4"  (1.626 m)   Wt 264 lb (119.7 kg)   SpO2 100%   BMI 45.32 kg/m   Physical Exam  General appearance - alert, well appearing, obese young African female and in no distress Mental status - normal mood, behavior, speech, dress, motor activity, and thought processes Neck - supple, no significant adenopathy Lymphatics - no palpable lymphadenopathy, no hepatosplenomegaly Chest - clear to auscultation, no wheezes, rales or rhonchi, symmetric air entry Heart - normal rate, regular rhythm, normal S1, S2, no murmurs,  rubs, clicks or gallops Abdomen - soft, nontender, nondistended, no masses or organomegaly Skin -mild hirsutism of dark hair under the chin.      Latest Ref Rng & Units 08/22/2022   10:52 AM 05/29/2021    6:20 PM 01/01/2018   10:36 AM  CMP  Glucose 70 - 99 mg/dL 90  80  80   BUN 6 - 23 mg/dL 12  6  6    Creatinine 0.40 - 1.20 mg/dL 1.61  0.96  0.45   Sodium 135 - 145 mEq/L 139  135  138   Potassium 3.5 - 5.1 mEq/L 5.0  4.0  4.5   Chloride 96 - 112 mEq/L 106  105  100   CO2 19 - 32 mEq/L 27  23  23    Calcium 8.4 - 10.5 mg/dL 9.5  8.6  9.8   Total Protein 6.0 - 8.3 g/dL 7.1  6.6    Total Bilirubin 0.2 - 1.2 mg/dL 0.5  0.2    Alkaline Phos 39 - 117 U/L 92  78    AST 0 - 37 U/L 13  19    ALT 0 - 35 U/L 15  20     Lipid Panel  No results found for: "CHOL", "TRIG", "HDL", "CHOLHDL", "VLDL", "LDLCALC", "LDLDIRECT"  CBC    Component Value Date/Time   WBC 3.7 (L) 08/22/2022 1052   RBC 4.34 08/22/2022 1052   HGB 12.9 08/22/2022 1052   HGB 12.9 01/01/2018 1036   HCT 37.8 08/22/2022 1052   HCT 39.4 01/01/2018 1036   PLT 298.0 08/22/2022 1052   PLT 337 01/01/2018 1036   MCV 87.1 08/22/2022 1052   MCV 88 01/01/2018 1036   MCH 28.8 05/29/2021 1820   MCHC 34.0 08/22/2022 1052   RDW 13.1 08/22/2022 1052   RDW 12.2 (L) 01/01/2018 1036   LYMPHSABS 1.9 08/22/2022 1052   LYMPHSABS 1.5 01/01/2018 1036   MONOABS 0.4 08/22/2022 1052   EOSABS 0.1 08/22/2022 1052   EOSABS 0.1 01/01/2018 1036   BASOSABS 0.0 08/22/2022 1052   BASOSABS 0.0 01/01/2018 1036    ASSESSMENT AND PLAN: 1. Establishing care with new doctor, encounter for   2. Leukopenia, unspecified type Looks like several years ago WBC had decreased to 3.6 but then subsequently had normalized.  We will recheck CBC today with differential.  Exam and history is unrevealing.  If numbers remain low but stable, we will observe.  If it has decreased further, we will refer to hematology oncology. - CBC With Diff/Platelet  3. Irregular  menses May have polycystic ovarian syndrome which I explained to her about this diagnosis.  We will check some basic hormone levels.  Follow-up with me in several weeks  for Pap - Prolactin - FSH/LH - Testosterone - TSH+T4F+T3Free  4. Hirsutism See #3 above. - Testosterone     Patient was given the opportunity to ask questions.  Patient verbalized understanding of the plan and was able to repeat key elements of the plan.   This documentation was completed using Paediatric nurse.  Any transcriptional errors are unintentional.  Orders Placed This Encounter  Procedures   CBC With Diff/Platelet   Prolactin   FSH/LH   Testosterone   TSH+T4F+T3Free     Requested Prescriptions    No prescriptions requested or ordered in this encounter    Return in about 7 weeks (around 11/11/2022) for PAP.  Jonah Blue, MD, FACP

## 2022-09-24 LAB — CBC WITH DIFF/PLATELET
Basophils Absolute: 0 10*3/uL (ref 0.0–0.2)
Basos: 1 %
EOS (ABSOLUTE): 0.1 10*3/uL (ref 0.0–0.4)
Eos: 2 %
Hematocrit: 38.9 % (ref 34.0–46.6)
Hemoglobin: 12.8 g/dL (ref 11.1–15.9)
Immature Grans (Abs): 0 10*3/uL (ref 0.0–0.1)
Immature Granulocytes: 0 %
Lymphocytes Absolute: 1.8 10*3/uL (ref 0.7–3.1)
Lymphs: 45 %
MCH: 28.6 pg (ref 26.6–33.0)
MCHC: 32.9 g/dL (ref 31.5–35.7)
MCV: 87 fL (ref 79–97)
Monocytes Absolute: 0.4 10*3/uL (ref 0.1–0.9)
Monocytes: 11 %
Neutrophils Absolute: 1.6 10*3/uL (ref 1.4–7.0)
Neutrophils: 41 %
Platelets: 298 10*3/uL (ref 150–450)
RBC: 4.47 x10E6/uL (ref 3.77–5.28)
RDW: 12.4 % (ref 11.7–15.4)
WBC: 4 10*3/uL (ref 3.4–10.8)

## 2022-09-24 LAB — TSH+T4F+T3FREE
Free T4: 1.29 ng/dL (ref 0.82–1.77)
T3, Free: 3.2 pg/mL (ref 2.0–4.4)
TSH: 1.75 u[IU]/mL (ref 0.450–4.500)

## 2022-09-24 LAB — FSH/LH
FSH: 5.5 m[IU]/mL
LH: 15 m[IU]/mL

## 2022-09-24 LAB — TESTOSTERONE: Testosterone: 42 ng/dL (ref 13–71)

## 2022-09-24 LAB — PROLACTIN: Prolactin: 27.1 ng/mL (ref 4.8–33.4)

## 2022-09-25 ENCOUNTER — Encounter: Payer: 59 | Admitting: Gastroenterology

## 2022-11-27 ENCOUNTER — Encounter: Payer: Self-pay | Admitting: Internal Medicine

## 2022-11-27 ENCOUNTER — Ambulatory Visit: Payer: Self-pay | Attending: Internal Medicine | Admitting: Internal Medicine

## 2022-11-27 ENCOUNTER — Other Ambulatory Visit (HOSPITAL_COMMUNITY)
Admission: RE | Admit: 2022-11-27 | Discharge: 2022-11-27 | Disposition: A | Discharge status: Home / Self Care | Payer: Self-pay | Source: Ambulatory Visit | Attending: Internal Medicine | Admitting: Internal Medicine

## 2022-11-27 VITALS — BP 121/76 | HR 70 | Ht 64.0 in | Wt 268.0 lb

## 2022-11-27 DIAGNOSIS — Z1159 Encounter for screening for other viral diseases: Secondary | ICD-10-CM

## 2022-11-27 DIAGNOSIS — Z124 Encounter for screening for malignant neoplasm of cervix: Secondary | ICD-10-CM | POA: Insufficient documentation

## 2022-11-27 DIAGNOSIS — Z6841 Body Mass Index (BMI) 40.0 and over, adult: Secondary | ICD-10-CM

## 2022-11-27 DIAGNOSIS — Z114 Encounter for screening for human immunodeficiency virus [HIV]: Secondary | ICD-10-CM

## 2022-11-27 DIAGNOSIS — Z113 Encounter for screening for infections with a predominantly sexual mode of transmission: Secondary | ICD-10-CM

## 2022-11-27 NOTE — Progress Notes (Signed)
Patient ID: Eileen Palmer, female    DOB: 02/25/2001  MRN: [id number]  CC: Gynecologic Exam (Pap. Demetrius Charity concerns - would like weight loss options/Would like to schedule nurse appt for Tdap vax)   Subjective: Eileen Palmer is a 22 y.o. female who presents for pap Her concerns today include:  Patient with history of GERD, constipation, irregular menses  GYN History:  Pt is G0P0 Any hx of abn paps?: this is her first pap Menses regular or irregular?: Last menses was beginning of July. On last visit she reported complains of irregular menses.  Menses started at the age of 89.  Menses were regular until about the age of 21 and then became irregular.  Now she sometimes may not have a menstrual cycle 1-2x/yr.  Other times menses may start late or come up to 5 days early.  Endorses some hirsutism under the chin that started about a year ago.  Some hair on the breast as well.  Mother also has hair under the chin.  No deepening of the voice. We did lab test.  We check hormone levels and these were normal How long does menses last? 3 days Menstrual flow light or heavy?: heavy on ist day with painful cramps Method of birth control?:  no and does not desire BC at this time Any vaginal dischg at this time?: no Dysuria?: no Any hx of STI?: no Sexually active with how many partners: not current.  Sexually active in past; last sexually active 05/2022 Desires STI screen: yes Last MMG: NA Family hx of uterine, cervical or breast cancer?:  no  Obesity:  wants to discuss wgh management Reports she gain wgh easy even as a child In her teens she was 230-235 lbs.  Now at 268 lbs.   She walks 2-3xwk for 1 hr for past 3 wks depending on work schedule.  Works in a [employer] which involves a lot of walking -feels eating habits not bad but eats late.  Works night.  Eats on break at 6 p.m (green banana with beans and spinach with fruits. Then eats noodles around 12 a.m once she gets home. Does not eat breakfast  because she sleeps until 11 a.m.  Drinks mainly water.   Patient Active Problem List   Diagnosis Date Noted   Constipation 06/11/2021   Abdominal pain, epigastric 06/11/2021     Current Outpatient Medications on File Prior to Visit  Medication Sig Dispense Refill   famotidine (PEPCID) 20 MG tablet Take 1 tablet (20 mg total) by mouth daily. 30 tablet 1   famotidine (PEPCID) 20 MG tablet Take 1 tablet (20 mg total) by mouth 2 (two) times daily. 60 tablet 2   No current facility-administered medications on file prior to visit.    Allergies  Allergen Reactions   Lactose Intolerance (Gi) Diarrhea and Nausea Only    Social History   Socioeconomic History   Marital status: Single    Spouse name: Not on file   Number of children: 0   Years of education: Not on file   Highest education level: Associate degree: academic program  Occupational History   Occupation: works in a Surveyor, quantity  Tobacco Use   Smoking status: Never   Smokeless tobacco: Never  Vaping Use   Vaping status: Never Used  Substance and Sexual Activity   Alcohol use: Not Currently    Comment: occasionally   Drug use: Never   Sexual activity: Not on file  Other Topics Concern  Not on file  Social History Narrative   Not on file   Social Determinants of Health   Financial Resource Strain: Not on file  Food Insecurity: Not on file  Transportation Needs: Not on file  Physical Activity: Sufficiently Active (01/01/2018)   Exercise Vital Sign    Days of Exercise per Week: 3 days    Minutes of Exercise per Session: 60 min  Stress: Not on file (05/22/2019)  Social Connections: Not on file  Intimate Partner Violence: Low Risk  (04/23/2020)   [facility]   Intimate Partner Violence    Insults You: Not on file    Threatens You: Not on file    Screams at [place]: Not on file    Physically Hurt: Not on file    Intimate Partner Violence Score: Not on file    Family History  Problem Relation Age  of Onset   Hyperlipidemia Mother    Diabetes Mother    Healthy Father     No past surgical history on file.  ROS: Review of Systems Negative except as stated above  PHYSICAL EXAM: BP 121/76 (BP Location: Left Arm, Patient Position: Sitting, Cuff Size: Large)   Pulse 70   Ht 5\' 4"  (1.626 m)   Wt 268 lb (121.6 kg)   SpO2 100%   BMI 46.00 kg/m   Wt Readings from Last 3 Encounters:  11/27/22 268 lb (121.6 kg)  09/23/22 264 lb (119.7 kg)  08/22/22 266 lb (120.7 kg)    Physical Exam  General appearance - alert, well appearing, and in no distress Mental status - normal mood, behavior, speech, dress, motor activity, and thought processes Pelvic -CMA Clarissa is present: Normal external genitalia, vulva, vagina, cervix, uterus and adnexa      Latest Ref Rng & Units 08/22/2022   10:52 AM 05/29/2021    6:20 PM 01/01/2018   10:36 AM  CMP  Glucose 70 - 99 mg/dL 90  80  80   BUN 6 - 23 mg/dL 12  6  6    Creatinine 0.40 - 1.20 mg/dL 6.57  8.46  9.62   Sodium 135 - 145 mEq/L 139  135  138   Potassium 3.5 - 5.1 mEq/L 5.0  4.0  4.5   Chloride 96 - 112 mEq/L 106  105  100   CO2 19 - 32 mEq/L 27  23  23    Calcium 8.4 - 10.5 mg/dL 9.5  8.6  9.8   Total Protein 6.0 - 8.3 g/dL 7.1  6.6    Total Bilirubin 0.2 - 1.2 mg/dL 0.5  0.2    Alkaline Phos 39 - 117 U/L 92  78    AST 0 - 37 U/L 13  19    ALT 0 - 35 U/L 15  20     Lipid Panel  No results found for: "CHOL", "TRIG", "HDL", "CHOLHDL", "VLDL", "LDLCALC", "LDLDIRECT"  CBC    Component Value Date/Time   WBC 4.0 09/23/2022 1007   WBC 3.7 (L) 08/22/2022 1052   RBC 4.47 09/23/2022 1007   RBC 4.34 08/22/2022 1052   HGB 12.8 09/23/2022 1007   HCT 38.9 09/23/2022 1007   PLT 298 09/23/2022 1007   MCV 87 09/23/2022 1007   MCH 28.6 09/23/2022 1007   MCH 28.8 05/29/2021 1820   MCHC 32.9 09/23/2022 1007   MCHC 34.0 08/22/2022 1052   RDW 12.4 09/23/2022 1007   LYMPHSABS 1.8 09/23/2022 1007   MONOABS 0.4 08/22/2022 1052   EOSABS  0.1  09/23/2022 1007   BASOSABS 0.0 09/23/2022 1007    ASSESSMENT AND PLAN:  1. Encounter for screening for cervical cancer Patient is not sure whether she received HPV virus series.  She has her immunization records at home.  She will bring them with her on next visit.  She also deferred on getting Tdap vaccine today for the same reason. - Cytology - PAP([place]) - Cervicovaginal ancillary only  2. Morbid obesity with BMI of 45.0-49.9, adult (HCC) Dietary counseling given.  Advised patient to prepare and carry her dinner to eat at 6 PM on her break.  Avoid eating heavy carbs like noodles when she gets off 12 AM in the mornings.  I would recommend eating some fruits or nuts before going to bed. Further dietary counseling given to include: Patient advised to eliminate sugary drinks from the diet, cut back on portion sizes especially of white carbohydrates, eat more white lean meat like chicken [place] and seafood instead of beef or pork and incorporate fresh fruits and vegetables into the diet daily. Continue trying to walk at least 3 days a week for an hour. Spoke with her about Wegovy.  Advised that she find out from her insurance whether this is on formulary for them.  If it is we can discuss putting her on this on next visit. - Amb ref to Medical Nutrition Therapy-MNT  3. Screening for HIV (human immunodeficiency virus) Agreeable to screening. - HIV antibody (with reflex)  4. Need for hepatitis C screening test Agreeable to screening. - Hepatitis C Antibody  5. Screen for sexually transmitted diseases - RPR w/reflex to TrepSure    Patient was given the opportunity to ask questions.  Patient verbalized understanding of the plan and was able to repeat key elements of the plan.   This documentation was completed using Paediatric nurse.  Any transcriptional errors are unintentional.  Orders Placed This Encounter  Procedures   HIV antibody (with reflex)    Hepatitis C Antibody   RPR w/reflex to TrepSure   Amb ref to Medical Nutrition Therapy-MNT     Requested Prescriptions    No prescriptions requested or ordered in this encounter    Return in about 7 weeks (around 01/15/2023).  Jonah Blue, MD, FACP

## 2022-11-27 NOTE — Patient Instructions (Signed)
Ask your insurance if the will pay for the weigh loss medication called Wegovy.   Healthy Eating, Adult Healthy eating may help you get and keep a healthy body weight, reduce the risk of chronic disease, and live a long and productive life. It is important to follow a healthy eating pattern. Your nutritional and calorie needs should be met mainly by different nutrient-rich foods. What are tips for following this plan? Reading food labels Read labels and choose the following: Reduced or low sodium products. Juices with 100% fruit juice. Foods with low saturated fats (<3 g per serving) and high polyunsaturated and monounsaturated fats. Foods with whole grains, such as whole wheat, cracked wheat, brown rice, and wild rice. Whole grains that are fortified with folic acid. This is recommended for females who are pregnant or who want to become pregnant. Read labels and do not eat or drink the following: Foods or drinks with added sugars. These include foods that contain brown sugar, corn sweetener, corn syrup, dextrose, fructose, glucose, high-fructose corn syrup, honey, invert sugar, lactose, malt syrup, maltose, molasses, raw sugar, sucrose, trehalose, or turbinado sugar. Limit your intake of added sugars to less than 10% of your total daily calories. Do not eat more than the following amounts of added sugar per day: 6 teaspoons (25 g) for females. 9 teaspoons (38 g) for males. Foods that contain processed or refined starches and grains. Refined grain products, such as white flour, degermed cornmeal, white bread, and white rice. Shopping Choose nutrient-rich snacks, such as vegetables, whole fruits, and nuts. Avoid high-calorie and high-sugar snacks, such as potato chips, fruit snacks, and candy. Use oil-based dressings and spreads on foods instead of solid fats such as butter, margarine, sour cream, or cream cheese. Limit pre-made sauces, mixes, and "instant" products such as flavored rice,  instant noodles, and ready-made pasta. Try more plant-protein sources, such as tofu, tempeh, black beans, edamame, lentils, nuts, and seeds. Explore eating plans such as the Mediterranean diet or vegetarian diet. Try heart-healthy dips made with beans and healthy fats like hummus and guacamole. Vegetables go great with these. Cooking Use oil to saut or stir-fry foods instead of solid fats such as butter, margarine, or lard. Try baking, boiling, grilling, or broiling instead of frying. Remove the fatty part of meats before cooking. Steam vegetables in water or broth. Meal planning  At meals, imagine dividing your plate into fourths: One-half of your plate is fruits and vegetables. One-fourth of your plate is whole grains. One-fourth of your plate is protein, especially lean meats, poultry, eggs, tofu, beans, or nuts. Include low-fat dairy as part of your daily diet. Lifestyle Choose healthy options in all settings, including home, work, school, restaurants, or stores. Prepare your food safely: Wash your hands after handling raw meats. Where you prepare food, keep surfaces clean by regularly washing with hot, soapy water. Keep raw meats separate from ready-to-eat foods, such as fruits and vegetables. Cook seafood, meat, poultry, and eggs to the recommended temperature. Get a food thermometer. Store foods at safe temperatures. In general: Keep cold foods at 63F (4.4C) or below. Keep hot foods at 163F (60C) or above. Keep your freezer at [facility] (-17.8C) or below. Foods are not safe to eat if they have been between the temperatures of 40-163F (4.4-60C) for more than 2 hours. What foods should I eat? Fruits Aim to eat 1-2 cups of fresh, canned (in natural juice), or frozen fruits each day. One cup of fruit equals 1 small apple, 1 large  banana, 8 large strawberries, 1 cup (237 g) canned fruit,  cup (82 g) dried fruit, or 1 cup (240 mL) 100% juice. Vegetables Aim to eat 2-4 cups of  fresh and frozen vegetables each day, including different varieties and colors. One cup of vegetables equals 1 cup (91 g) broccoli or cauliflower florets, 2 medium carrots, 2 cups (150 g) raw, leafy greens, 1 large tomato, 1 large bell pepper, 1 large sweet potato, or 1 medium white potato. Grains Aim to eat 5-10 ounce-equivalents of whole grains each day. Examples of 1 ounce-equivalent of grains include 1 slice of bread, 1 cup (40 g) ready-to-eat cereal, 3 cups (24 g) popcorn, or  cup (93 g) cooked rice. Meats and other proteins Try to eat 5-7 ounce-equivalents of protein each day. Examples of 1 ounce-equivalent of protein include 1 egg,  oz nuts (12 almonds, 24 pistachios, or 7 walnut halves), 1/4 cup (90 g) cooked beans, 6 tablespoons (90 g) hummus or 1 tablespoon (16 g) peanut butter. A cut of meat or fish that is the size of a deck of cards is about 3-4 ounce-equivalents (85 g). Of the protein you eat each week, try to have at least 8 sounce (227 g) of seafood. This is about 2 servings per week. This includes salmon, trout, herring, sardines, and anchovies. Dairy Aim to eat 3 cup-equivalents of fat-free or low-fat dairy each day. Examples of 1 cup-equivalent of dairy include 1 cup (240 mL) milk, 8 ounces (250 g) yogurt, 1 ounces (44 g) natural cheese, or 1 cup (240 mL) fortified soy milk. Fats and oils Aim for about 5 teaspoons (21 g) of fats and oils per day. Choose monounsaturated fats, such as canola and olive oils, mayonnaise made with olive oil or avocado oil, avocados, peanut butter, and most nuts, or polyunsaturated fats, such as sunflower, corn, and soybean oils, walnuts, pine nuts, sesame seeds, sunflower seeds, and flaxseed. Beverages Aim for 6 eight-ounce glasses of water per day. Limit coffee to 3-5 eight-ounce cups per day. Limit caffeinated beverages that have added calories, such as soda and energy drinks. If you drink alcohol: Limit how much you have to: 0-1 drink a day if you  are female. 0-2 drinks a day if you are female. Know how much alcohol is in your drink. In the [place] one drink is one 12 oz bottle of beer (355 mL), one 5 oz glass of wine (148 mL), or one 1 oz glass of hard liquor (44 mL). Seasoning and other foods Try not to add too much salt to your food. Try using herbs and spices instead of salt. Try not to add sugar to food. This information is based on [place] nutrition guidelines. To learn more, visit DisposableNylon.be. Exact amounts may vary. You may need different amounts. This information is not intended to replace advice given to you by your health care provider. Make sure you discuss any questions you have with your health care provider. Document Revised: 12/30/2021 Document Reviewed: 12/30/2021 Elsevier Patient Education  2024 [employer].

## 2022-11-28 ENCOUNTER — Other Ambulatory Visit: Payer: Self-pay | Admitting: Internal Medicine

## 2022-11-28 LAB — CERVICOVAGINAL ANCILLARY ONLY
Bacterial Vaginitis (gardnerella): POSITIVE — AB
Candida Glabrata: NEGATIVE
Candida Vaginitis: POSITIVE — AB
Chlamydia: NEGATIVE
Comment: NEGATIVE
Comment: NEGATIVE
Comment: NEGATIVE
Comment: NEGATIVE
Comment: NEGATIVE
Comment: NORMAL
Neisseria Gonorrhea: NEGATIVE
Trichomonas: NEGATIVE

## 2022-11-28 LAB — RPR W/REFLEX TO TREPSURE: RPR: NONREACTIVE

## 2022-11-28 LAB — HEPATITIS C ANTIBODY: Hep C Virus Ab: NONREACTIVE

## 2022-11-28 LAB — HIV ANTIBODY (ROUTINE TESTING W REFLEX): HIV Screen 4th Generation wRfx: NONREACTIVE

## 2022-11-28 LAB — TREPONEMAL ANTIBODIES, TPPA: Treponemal Antibodies, TPPA: NONREACTIVE

## 2022-11-28 MED ORDER — FLUCONAZOLE 150 MG PO TABS: 150.0000 mg | ORAL_TABLET | Freq: Every day | ORAL | 0 refills | Status: DC

## 2022-11-28 MED ORDER — METRONIDAZOLE 500 MG PO TABS
500.0000 mg | ORAL_TABLET | Freq: Two times a day (BID) | ORAL | 0 refills | Status: DC
Start: 2022-11-28 — End: 2023-01-02

## 2022-12-01 LAB — CYTOLOGY - PAP: Diagnosis: NEGATIVE

## 2022-12-28 ENCOUNTER — Encounter: Payer: Self-pay | Admitting: Internal Medicine

## 2023-01-02 ENCOUNTER — Encounter: Payer: Self-pay | Admitting: Internal Medicine

## 2023-01-02 ENCOUNTER — Telehealth: Payer: Self-pay

## 2023-01-02 ENCOUNTER — Telehealth: Payer: 59 | Admitting: Internal Medicine

## 2023-01-02 ENCOUNTER — Other Ambulatory Visit: Payer: Self-pay

## 2023-01-02 DIAGNOSIS — Z6841 Body Mass Index (BMI) 40.0 and over, adult: Secondary | ICD-10-CM

## 2023-01-02 MED ORDER — WEGOVY 0.25 MG/0.5ML ~~LOC~~ SOAJ: 0.2500 mg | SUBCUTANEOUS | 1 refills | Status: DC

## 2023-01-02 NOTE — Telephone Encounter (Signed)
Pharmacy Patient Advocate Encounter   Received notification from Physician's Office that prior authorization for [facility] is required/requested.   Insurance verification completed.   The patient is insured through  [employer]  .   Per test claim: PA required; PA submitted to [employer] via CoverMyMeds Key/confirmation #/EOC [id number] Status is pending

## 2023-01-02 NOTE — Progress Notes (Signed)
Patient ID: Eileen Palmer, female   DOB: 2001/03/28, 22 y.o.   MRN: [id number] Virtual Visit via Video Note  I connected with Eileen Palmer on 01/02/2023 at 8:13 AM by a video enabled telemedicine application and verified that I am speaking with the correct person using two identifiers.  Location: Patient: office Provider: Office   I discussed the limitations of evaluation and management by telemedicine and the availability of in person appointments. The patient expressed understanding and agreed to proceed.  History of Present Illness: Patient with history of GERD, constipation, irregular menses.  Today's visit is for f/u obesity. Since last visit pt reports she has increased water intake, cut back on sugar drinks and cut out eating noodles.  Changed work schedule to 3rd shift so now eating dinner before going to work. 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  Does a lot of walking while at work. Would like to try [facility]; she has spoken to her insurance. No personal or fhx of thyroid cancer. No personal hx of pancreatitis or gall stones.   Wgh on last visit was 268 lb.  Current Outpatient Medications on File Prior to Visit  Medication Sig Dispense Refill   famotidine (PEPCID) 20 MG tablet Take 1 tablet (20 mg total) by mouth daily. 30 tablet 1   famotidine (PEPCID) 20 MG tablet Take 1 tablet (20 mg total) by mouth 2 (two) times daily. 60 tablet 2   No current facility-administered medications on file prior to visit.      Observations/Objective: Young african female in NAD  Assessment and Plan: 1. Morbid obesity with BMI of 45.0-49.9, adult (HCC) Commended patient on changes made in her diet so far.  Encouraged her to continue to cut back on the white carbs and eliminate the sugary drinks. Encouraged her to continue to move as much as she can. I think she is a good candidate for [facility].  She does not have any absolute contraindications for the medication. Went over with her how  the medication works and possible side effects including nausea, vomiting, pancreatitis, bowel blockage, severe constipation/diarrhea.. Advised to expect some nausea the first several weeks. If she develops any vomiting or abdominal pain or severe diarrhea/constipation, she is advised to stop the medication and be seen Start with the lowest dose of 0.25 mg once a week.  Advised to have the pharmacist show her how to administer the medication once it is dispensed.  Message sent to our pharmacy tech to try to get prior approval for her. - Semaglutide-Weight Management (WEGOVY) 0.25 MG/0.5ML SOAJ; Inject 0.25 mg into the skin once a week.  Dispense: 2 mL; Refill: 1   Follow Up Instructions: 6 wks   I discussed the assessment and treatment plan with the patient. The patient was provided an opportunity to ask questions and all were answered. The patient agreed with the plan and demonstrated an understanding of the instructions.   The patient was advised to call back or seek an in-person evaluation if the symptoms worsen or if the condition fails to improve as anticipated.  I spent 13 minutes dedicated to the care of this patient on the date of this encounter to include previsit review of of my last note, face-to-face time with patient discussing diagnosis and management and post visit entry of order.  This note has been created with Education officer, environmental. Any transcriptional errors are unintentional.  Jonah Blue, MD

## 2023-01-03 ENCOUNTER — Telehealth: Payer: Self-pay | Admitting: Internal Medicine

## 2023-01-03 NOTE — Telephone Encounter (Signed)
-----   Message from Weldon Picking sent at 01/02/2023  4:24 PM EDT ----- Regarding: RE: [id number] prior approval needed Submitted, waiting on insurance's decision ----- Message ----- From: Marcine Matar, MD Sent: 01/02/2023   8:33 AM EDT To: Weldon Picking, CPhT Subject: WFUXNA prior approval needed                   Please try to get her approved for [facility]

## 2023-01-05 ENCOUNTER — Other Ambulatory Visit: Payer: Self-pay

## 2023-01-19 ENCOUNTER — Other Ambulatory Visit: Payer: Self-pay

## 2023-01-20 ENCOUNTER — Ambulatory Visit: Payer: Self-pay | Admitting: Dietician

## 2023-02-01 ENCOUNTER — Telehealth: Payer: 59

## 2023-02-01 NOTE — Progress Notes (Signed)
Pt did not show for visit. DWB 

## 2023-02-02 ENCOUNTER — Telehealth: Payer: 59

## 2023-02-02 DIAGNOSIS — N750 Cyst of Bartholin's gland: Secondary | ICD-10-CM

## 2023-02-02 MED ORDER — DOXYCYCLINE HYCLATE 100 MG PO TABS: 100.0000 mg | ORAL_TABLET | Freq: Two times a day (BID) | ORAL | 0 refills | Status: DC

## 2023-02-02 NOTE — Progress Notes (Signed)
Virtual Visit Consent   Eileen Palmer, you are scheduled for a virtual visit with a [facility] provider today. Just as with appointments in the office, your consent must be obtained to participate. Your consent will be active for this visit and any virtual visit you may have with one of our providers in the next 365 days. If you have a MyChart account, a copy of this consent can be sent to you electronically.  As this is a virtual visit, video technology does not allow for your provider to perform a traditional examination. This may limit your provider's ability to fully assess your condition. If your provider identifies any concerns that need to be evaluated in person or the need to arrange testing (such as labs, EKG, etc.), we will make arrangements to do so. Although advances in technology are sophisticated, we cannot ensure that it will always work on either your end or our end. If the connection with a video visit is poor, the visit may have to be switched to a telephone visit. With either a video or telephone visit, we are not always able to ensure that we have a secure connection.  By engaging in this virtual visit, you consent to the provision of healthcare and authorize for your insurance to be billed (if applicable) for the services provided during this visit. Depending on your insurance coverage, you may receive a charge related to this service.  I need to obtain your verbal consent now. Are you willing to proceed with your visit today? Eileen Palmer has provided verbal consent on 02/02/2023 for a virtual visit (video or telephone). Eileen Loveless, PA-C  Date: 02/02/2023 8:10 AM  Virtual Visit via Video Note   I, Eileen Palmer, connected with  Eileen Palmer  ([id number], [birth year]) on 02/02/23 at  8:00 AM EDT by a video-enabled telemedicine application and verified that I am speaking with the correct person using two identifiers.  Location: Patient: Virtual Visit Location  Patient: Mobile Provider: Virtual Visit Location Provider: Home Office   I discussed the limitations of evaluation and management by telemedicine and the availability of in person appointments. The patient expressed understanding and agreed to proceed.    History of Present Illness: Eileen Palmer is a 22 y.o. who identifies as a female who was assigned female at birth, and is being seen today for vaginal lesion. Noticed about a week ago and has been getting larger. Bump is located on the left labia. Yesterday reports had some sanguinopurulent discharge while wiping. Does have pain with any palpation/pressure. Did take tylenol this morning with no relief. Denies fevers, chills, nausea, vomiting. Had pap about 2-3 months ago and was normal. Is not currently sexually active.   Problems:  Patient Active Problem List   Diagnosis Date Noted   Constipation 06/11/2021   Abdominal pain, epigastric 06/11/2021    Allergies:  Allergies  Allergen Reactions   Lactose Intolerance (Gi) Diarrhea and Nausea Only   Medications:  Current Outpatient Medications:    doxycycline (VIBRA-TABS) 100 MG tablet, Take 1 tablet (100 mg total) by mouth 2 (two) times daily., Disp: 20 tablet, Rfl: 0   famotidine (PEPCID) 20 MG tablet, Take 1 tablet (20 mg total) by mouth daily., Disp: 30 tablet, Rfl: 1   famotidine (PEPCID) 20 MG tablet, Take 1 tablet (20 mg total) by mouth 2 (two) times daily., Disp: 60 tablet, Rfl: 2   Semaglutide-Weight Management (WEGOVY) 0.25 MG/0.5ML SOAJ, Inject 0.25 mg into the skin once a week., Disp:  2 mL, Rfl: 1  Observations/Objective: Patient is well-developed, well-nourished in no acute distress.  Resting comfortably  Head is normocephalic, atraumatic.  No labored breathing.  Speech is clear and coherent with logical content.  Patient is alert and oriented at baseline.    Assessment and Plan: 1. Bartholin cyst - doxycycline (VIBRA-TABS) 100 MG tablet; Take 1 tablet (100 mg total)  by mouth 2 (two) times daily.  Dispense: 20 tablet; Refill: 0  - Suspect infected Barthlolin cyst - Doxycycline prescribed - Epsom salt soaks - Warm compresses - Tylenol and/or ibuprofen as needed - Advised to seek in person evaluation if not improving in 3 days  Follow Up Instructions: I discussed the assessment and treatment plan with the patient. The patient was provided an opportunity to ask questions and all were answered. The patient agreed with the plan and demonstrated an understanding of the instructions.  A copy of instructions were sent to the patient via MyChart unless otherwise noted below.    The patient was advised to call back or seek an in-person evaluation if the symptoms worsen or if the condition fails to improve as anticipated.    Eileen Loveless, PA-C

## 2023-02-02 NOTE — Patient Instructions (Signed)
Daleen Snook, thank you for joining Margaretann Loveless, PA-C for today's virtual visit.  While this provider is not your primary care provider (PCP), if your PCP is located in our provider database this encounter information will be shared with them immediately following your visit.   A [facility] MyChart account gives you access to today's visit and all your visits, tests, and labs performed at [facility] " click here if you don't have a [facility] MyChart account or go to [email or web address]  Consent: (Patient) Eileen Palmer provided verbal consent for this virtual visit at the beginning of the encounter.  Current Medications:  Current Outpatient Medications:    doxycycline (VIBRA-TABS) 100 MG tablet, Take 1 tablet (100 mg total) by mouth 2 (two) times daily., Disp: 20 tablet, Rfl: 0   famotidine (PEPCID) 20 MG tablet, Take 1 tablet (20 mg total) by mouth daily., Disp: 30 tablet, Rfl: 1   famotidine (PEPCID) 20 MG tablet, Take 1 tablet (20 mg total) by mouth 2 (two) times daily., Disp: 60 tablet, Rfl: 2   Semaglutide-Weight Management (WEGOVY) 0.25 MG/0.5ML SOAJ, Inject 0.25 mg into the skin once a week., Disp: 2 mL, Rfl: 1   Medications ordered in this encounter:  Meds ordered this encounter  Medications   doxycycline (VIBRA-TABS) 100 MG tablet    Sig: Take 1 tablet (100 mg total) by mouth 2 (two) times daily.    Dispense:  20 tablet    Refill:  0    Order Specific Question:   Supervising Provider    Answer:   Merrilee Jansky [id number]     *If you need refills on other medications prior to your next appointment, please contact your pharmacy*  Follow-Up: Call back or seek an in-person evaluation if the symptoms worsen or if the condition fails to improve as anticipated.  [facility] [phone]  Other Instructions  Epsom salt soak: 1-2 cups of Epsom salt in warm bath water, dissolve. Sit for at least 15 minutes  Bartholin's Cyst  A  Bartholin's cyst is a fluid-filled sac that forms as a result of a blockage along the tube (duct) of the Bartholin's gland. Bartholin's glands are small glands in the folds of skin around the vaginal opening (labia). These glands produce fluid to moisten or lubricate the outside of the vagina during sex. A cyst that is not large or infected may not cause any problems or require treatment. If the cyst gets infected with bacteria, it is called a Bartholin's abscess. An abscess may cause symptoms such as pain and swelling and is more likely to require treatment. What are the causes? This condition may be caused by a blocked Bartholin's gland duct. These ducts can become blocked due to natural buildup of fluid and oils. Bacteria inside of the cyst can cause infection. In many cases, the cause is not known. What are the signs or symptoms? Symptoms may include: A bulge or lump on the labia, near the lower opening of the vagina. Discomfort or pain. This may get worse during sex or when walking. Redness, swelling, or fluid draining from the area. These may be signs of an abscess. How severe your symptoms are depends on the size of your cyst and whether it is infected. Infection causes symptoms to get more severe. How is this diagnosed? This condition may be diagnosed based on: Your symptoms and medical history. A physical exam to check for swelling in your vaginal area. You may lie on your back  on an exam table and have your feet placed into footrests for the exam. Blood tests to check for infections. Removal of a fluid sample from the cyst or abscess (biopsy) for testing. You may work with a health care provider who specializes in [employer] health (gynecologist) for diagnosis and treatment. How is this treated? If your cyst is small, not infected, and not causing symptoms, you may not need treatment. These cysts often go away on their own, with home care such as hot baths or warm compresses. If you have a  large cyst or an abscess, treatment may include: Antibiotic medicine. A procedure to drain the fluid inside the cyst or abscess. These procedures involve making an incision in the cyst or abscess so that the fluid drains out, and then one of the following may be done: A small, thin tube (catheter) may be placed inside the cyst or abscess so that it does not close and fill up with fluid again (fistulization). The catheter will be removed at a follow-up visit. The edges of the incision may be stitched to your skin so that the cyst or abscess stays open (marsupialization). This allows it to continue to drain and not fill up with fluid again. If you have cysts or abscesses that keep returning (recurring) and have required incision and drainage multiple times, your health care provider may talk with you about surgery to remove the Bartholin's gland. Follow these instructions at home: Medicines Take over-the-counter and prescription medicines only as told by your health care provider. If you were prescribed an antibiotic medicine, take it as told by your health care provider. Do not stop taking the antibiotic even if your condition improves. Managing pain and swelling Try sitz baths to help with pain and swelling. A sitz bath is a warm water bath in which the water only comes up to your hips and should cover your buttocks. You may take sitz baths several times a day. Apply heat to the affected area as often as needed. Use the heat source that your health care provider recommends, such as a moist heat pack or a heating pad. Place a towel between your skin and the heat source. Leave the heat on for 20-30 minutes. Remove the heat if your skin turns bright red. This is especially important if you are unable to feel pain, heat, or cold. You may have a greater risk of getting burned. Do not fall asleep with the heating pad in place. General instructions If your cyst or abscess was drained, follow instructions  from your health care provider about how to take care of your wound. Use feminine pads as needed to absorb any drainage. Do not push on or squeeze your cyst. Do not have sex until the cyst has gone away or your wound from drainage has healed. Take these steps to help prevent a Bartholin's cyst from returning and to prevent other Bartholin's cysts from developing: Take a bath or shower once a day. Clean your vaginal area with mild soap and water when you bathe. Practice safe sex to prevent STIs. Talk with your health care provider about how to prevent STIs and which forms of birth control (contraception) may be best for you. Keep all follow-up visits. This is important. Contact a health care provider if: You have a fever. You develop increasing redness, swelling, or pain around your cyst. You have fluid, blood, pus, or a bad smell coming from your cyst. You have a cyst that gets larger or comes back.  Summary A Bartholin's cyst is a fluid-filled sac that forms as a result of a blockage along the duct of the Bartholin's gland. If your cyst is small, not infected, and not causing symptoms, you may not need any treatment. If you have a large cyst or an abscess, your health care provider may perform a procedure to drain the fluid. If you have cysts or abscesses that keep returning (recurring) and have required incision and drainage multiple times, your health care provider may talk with you about surgery to remove the Bartholin's gland. This information is not intended to replace advice given to you by your health care provider. Make sure you discuss any questions you have with your health care provider. Document Revised: 08/29/2019 Document Reviewed: 08/29/2019 Elsevier Patient Education  2024 Elsevier Inc.    If you have been instructed to have an in-person evaluation today at a local Urgent Care facility, please use the link below. It will take you to a list of all of our available [facility], including address, phone number and hours of operation. Please do not delay care.  [facility]  If you or a family member do not have a primary care provider, use the link below to schedule a visit and establish care. When you choose a [facility] primary care physician or advanced practice provider, you gain a long-term partner in health. Find a Primary Care Provider  Learn more about [facility]'s in-office and virtual care options: [facility] - Get Care Now

## 2023-03-26 ENCOUNTER — Encounter: Payer: Self-pay | Admitting: Dietician

## 2023-03-26 ENCOUNTER — Encounter: Payer: 59 | Attending: Internal Medicine | Admitting: Dietician

## 2023-03-26 NOTE — Progress Notes (Signed)
Medical Nutrition Therapy  Appointment Start time:  1500  Appointment End time:  1553  Primary concerns today: weight loss and behaviors to implement for healthy lifestyle  Referral diagnosis: E66.01 Preferred learning style: no preference indicated Learning readiness: ready   NUTRITION ASSESSMENT   Anthropometrics  Ht: 5'5 1/2" Wt: 265 lbs  Clinical Medical Hx: GERD Medications: none Labs: reviewed Notable Signs/Symptoms: tiredness Food Allergies: no food allergies, lactose intolerance  Lifestyle & Dietary Hx  Pt states her physician referred her for weight loss. Pt works full-time at [employer] and is a Chief Financial Officer at [employer] Pt works from 2:00 PM-10:00 PM, and gets home around 1030 PM, has a snack/light meal. Pt reports she tries to go to sleep around 1:15 AM but sometimes cannot fall asleep until 3PM. Pt reports she takes a Goli Ashwagandha gummy each night.  Pt reports her mom does most of the cooking, avoiding using oils, and her mom has diabetes. Pt reports a decreased appetite over the past couple years. Pt reports she played soccer in high school and took a weight lifting class, and really enjoyed these activities. Pt reports that she may be interested in playing soccer with her boyfriend. Pt also reports her uncle gave her advice to go walking, which she picked up for awhile and enjoyed it.   Pt states she has a gym membership and exercise equipment at home.   Estimated daily fluid intake: 64 oz Supplements: melatonin daily Sleep: 6+ hours, not sleeping well, takes a lot time to fall asleep Stress / self-care: moderate stress, drawing Current average weekly physical activity: sometimes walks 2x week  24-Hr Dietary Recall First Meal: skips Snack: none Second Meal: 12PM: chicken or fish, boiled potatoes, green beans and carrots Snack: sometimes roasted sweet potato Third Meal: 6PM: chicken or fish, boiled potatoes, green beans and carrots Snack: sweet  potato or quick snack Beverages: green, black or ginger tea, lemonade, water   NUTRITION DIAGNOSIS  NB-1.1 Food and nutrition-related knowledge deficit As related to no previous nutrition education.  As evidenced by pt report.   NUTRITION INTERVENTION  Nutrition education (E-1) on the following topics:  Portion sizes: utilize food models to discuss portion sizes Fruits & Vegetables: Aim to fill half your plate with a variety of fruits and vegetables. They are rich in vitamins, minerals, and fiber, and can help reduce the risk of chronic diseases. Choose a colorful assortment of fruits and vegetables to ensure you get a wide range of nutrients. Grains and Starches: Make at least half of your grain choices whole grains, such as brown rice, whole wheat bread, and oats. Whole grains provide fiber, which aids in digestion and healthy cholesterol levels. Aim for whole forms of starchy vegetables such as potatoes, sweet potatoes, beans, peas, and corn, which are fiber rich and provide many vitamins and minerals.  Protein: Incorporate lean sources of protein, such as poultry, fish, beans, nuts, and seeds, into your meals. Protein is essential for building and repairing tissues, staying full, balancing blood sugar, as well as supporting immune function. Dairy: Include low-fat or fat-free dairy products like milk, yogurt, and cheese in your diet. Dairy foods are excellent sources of calcium and vitamin D, which are crucial for bone health.   Finding an exercise you enjoy is crucial for maintaining long-term fitness and overall health. Enjoyable activities are more likely to become regular habits, making it easier to stay consistent with physical activity. When you look forward to your workouts, exercise becomes a  positive experience rather than a chore, reducing the likelihood of burnout or quitting. Enjoyable exercise also enhances mental well-being, as engaging in activities you love can boost mood, reduce  stress, and provide a sense of accomplishment.  Impact of sleep on nutrition: Inadequate sleep can have significant impacts on nutrition by increasing appetite and cravings for high-calorie foods, altering food choices, and disrupting metabolic processes. This can lead to weight gain, poor glucose management, and impaired nutrient absorption. Additionally, sleep deprivation often results in reduced physical activity, further affecting overall health. Tips for better sleep: To improve sleep quality, maintain a consistent sleep schedule and create a relaxing bedtime routine. Optimize your sleep environment by keeping it cool, dark, and quiet, and limit screen time before bed. Be mindful of food and drink intake, engage in regular physical activity, and manage stress effectively. By prioritizing better sleep habits, you can positively influence your nutrition and overall well-being.  Handouts Provided Include  Plate Method  Learning Style & Readiness for Change Teaching method utilized: Visual & Auditory  Demonstrated degree of understanding via: Teach Back  Barriers to learning/adherence to lifestyle change: none  Goals Established by Pt Fitness day 3x per week for at least 30 minutes Establish a consistent sleep routine  MONITORING & EVALUATION Dietary intake, weekly physical activity, and follow up in 2 months.  Next Steps  Patient is to call for questions.

## 2023-03-26 NOTE — Patient Instructions (Signed)
Goals Established by Pt Fitness day 3x per week for at least 30 minutes Establish a consistent sleep routine

## 2023-05-21 ENCOUNTER — Ambulatory Visit: Payer: 59 | Admitting: Dietician

## 2023-09-07 ENCOUNTER — Ambulatory Visit
Admission: RE | Admit: 2023-09-07 | Discharge: 2023-09-07 | Disposition: A | Discharge status: Home / Self Care | Payer: Self-pay | Source: Ambulatory Visit | Attending: Family Medicine | Admitting: Family Medicine

## 2023-09-07 ENCOUNTER — Other Ambulatory Visit: Payer: Self-pay

## 2023-09-07 VITALS — BP 132/85 | HR 54 | Temp 97.8°F | Resp 17

## 2023-09-07 DIAGNOSIS — N760 Acute vaginitis: Secondary | ICD-10-CM | POA: Insufficient documentation

## 2023-09-07 MED ORDER — FLUCONAZOLE 150 MG PO TABS: 150.0000 mg | ORAL_TABLET | Freq: Every day | ORAL | 0 refills | Status: AC

## 2023-09-07 NOTE — ED Triage Notes (Signed)
 Pt c/o white cheesy vaginal dischargex2d. Pt denies any other sx.

## 2023-09-07 NOTE — ED Provider Notes (Signed)
 [facility]    CSN: [id number] Arrival date & time: 09/07/23  1017      History   Chief Complaint Chief Complaint  Patient presents with   Vaginal Discharge    More like yeast infection - Entered by patient    HPI Eileen Palmer is a 23 y.o. female presents for vaginal discharge.  Patient reports 2 days of a thick, pruritic vaginal discharge.  Denies any dysuria, fevers, nausea/vomiting, flank pain.  No STD exposure or concern.  Reports history of both BV and yeast infections.  No OTC treatments have been used since onset.  Denies pregnancy or breast-feeding.  No other concerns at this time.   Vaginal Discharge   Past Medical History:  Diagnosis Date   GERD (gastroesophageal reflux disease)     Patient Active Problem List   Diagnosis Date Noted   Constipation 06/11/2021   Abdominal pain, epigastric 06/11/2021    History reviewed. No pertinent surgical history.  OB History   No obstetric history on file.      Home Medications    Prior to Admission medications   Medication Sig Start Date End Date Taking? Authorizing Provider  fluconazole  (DIFLUCAN ) 150 MG tablet Take 1 tablet (150 mg total) by mouth daily for 2 doses. Take 1 tablet today and you may repeat in 3 days if symptoms persist 09/07/23 09/09/23 Yes Anacristina Steffek, Jodi R, NP  doxycycline  (VIBRA -TABS) 100 MG tablet Take 1 tablet (100 mg total) by mouth 2 (two) times daily. 02/02/23   Angelia Kelp, PA-C  famotidine  (PEPCID ) 20 MG tablet Take 1 tablet (20 mg total) by mouth daily. 06/11/21   Kennedy-Smith, Colleen M, NP  famotidine  (PEPCID ) 20 MG tablet Take 1 tablet (20 mg total) by mouth 2 (two) times daily. 08/22/22   Kennedy-Smith, Colleen M, NP  Semaglutide -Weight Management (WEGOVY ) 0.25 MG/0.5ML SOAJ Inject 0.25 mg into the skin once a week. 01/02/23   Lawrance Presume, MD    Family History Family History  Problem Relation Age of Onset   Hyperlipidemia Mother    Diabetes Mother    Healthy  Father     Social History Social History   Tobacco Use   Smoking status: Never   Smokeless tobacco: Never  Vaping Use   Vaping status: Never Used  Substance Use Topics   Alcohol use: Not Currently    Comment: occasionally   Drug use: Never     Allergies   Lactose intolerance (gi)   Review of Systems Review of Systems  Genitourinary:  Positive for vaginal discharge.     Physical Exam Triage Vital Signs ED Triage Vitals  Encounter Vitals Group     BP 09/07/23 1028 132/85     Systolic BP Percentile --      Diastolic BP Percentile --      Pulse Rate 09/07/23 1028 (!) 54     Resp 09/07/23 1028 17     Temp 09/07/23 1028 97.8 F (36.6 C)     Temp Source 09/07/23 1028 Oral     SpO2 09/07/23 1028 97 %     Weight --      Height --      Head Circumference --      Peak Flow --      Pain Score 09/07/23 1025 0     Pain Loc --      Pain Education --      Exclude from Growth Chart --    No data found.  Updated  Vital Signs BP 132/85   Pulse (!) 54   Temp 97.8 F (36.6 C) (Oral)   Resp 17   LMP 09/07/2023   SpO2 97%   Visual Acuity Right Eye Distance:   Left Eye Distance:   Bilateral Distance:    Right Eye Near:   Left Eye Near:    Bilateral Near:     Physical Exam Vitals and nursing note reviewed.  Constitutional:      Appearance: Normal appearance.  HENT:     Head: Normocephalic and atraumatic.  Eyes:     Pupils: Pupils are equal, round, and reactive to light.  Cardiovascular:     Rate and Rhythm: Bradycardia present.  Pulmonary:     Effort: Pulmonary effort is normal.  Skin:    General: Skin is warm and dry.  Neurological:     General: No focal deficit present.     Mental Status: She is alert and oriented to person, place, and time.  Psychiatric:        Mood and Affect: Mood normal.        Behavior: Behavior normal.      UC Treatments / Results  Labs (all labs ordered are listed, but only abnormal results are displayed) Labs Reviewed   CERVICOVAGINAL ANCILLARY ONLY    EKG   Radiology No results found.  Procedures Procedures (including critical care time)  Medications Ordered in UC Medications - No data to display  Initial Impression / Assessment and Plan / UC Course  I have reviewed the triage vital signs and the nursing notes.  Pertinent labs & imaging results that were available during my care of the patient were reviewed by me and considered in my medical decision making (see chart for details).     Reviewed exam and symptoms with patient.  No red flags.  Vaginal swab was ordered and will contact for any positive results.  Patient declined STD testing.  Start Diflucan .  Advise gynecology follow-up if symptoms do not improve.  ER precautions reviewed and patient verbalized understanding. Final Clinical Impressions(s) / UC Diagnoses   Final diagnoses:  Acute vaginitis     Discharge Instructions      The clinic will contact you with results of the vaginal swab done today if positive.  Start Diflucan  as prescribed.  Please follow-up with your gynecologist if your symptoms do not improve.  Please go to the ER for any worsening symptoms.  Hope you feel better soon!  ED Prescriptions     Medication Sig Dispense Auth. Provider   fluconazole  (DIFLUCAN ) 150 MG tablet Take 1 tablet (150 mg total) by mouth daily for 2 doses. Take 1 tablet today and you may repeat in 3 days if symptoms persist 2 tablet Lona Six, Jodi R, NP      PDMP not reviewed this encounter.   Alleen Arbour, NP 09/07/23 1046

## 2023-09-07 NOTE — Discharge Instructions (Addendum)
 The clinic will contact you with results of the vaginal swab done today if positive.  Start Diflucan  as prescribed.  Please follow-up with your gynecologist if your symptoms do not improve.  Please go to the ER for any worsening symptoms.  Hope you feel better soon!

## 2023-09-08 LAB — CERVICOVAGINAL ANCILLARY ONLY
Bacterial Vaginitis (gardnerella): NEGATIVE
Candida Glabrata: NEGATIVE
Candida Vaginitis: POSITIVE — AB
Comment: NEGATIVE
Comment: NEGATIVE
Comment: NEGATIVE

## 2023-09-09 ENCOUNTER — Ambulatory Visit (HOSPITAL_COMMUNITY): Payer: Self-pay

## 2023-09-10 ENCOUNTER — Telehealth: Payer: Self-pay | Admitting: Family Medicine

## 2023-09-10 DIAGNOSIS — N949 Unspecified condition associated with female genital organs and menstrual cycle: Secondary | ICD-10-CM

## 2023-09-10 NOTE — Progress Notes (Signed)
 Virtual Visit Consent   Eileen Palmer, you are scheduled for a virtual visit with a [facility] provider today. Just as with appointments in the office, your consent must be obtained to participate. Your consent will be active for this visit and any virtual visit you may have with one of our providers in the next 365 days. If you have a MyChart account, a copy of this consent can be sent to you electronically.  As this is a virtual visit, video technology does not allow for your provider to perform a traditional examination. This may limit your provider's ability to fully assess your condition. If your provider identifies any concerns that need to be evaluated in person or the need to arrange testing (such as labs, EKG, etc.), we will make arrangements to do so. Although advances in technology are sophisticated, we cannot ensure that it will always work on either your end or our end. If the connection with a video visit is poor, the visit may have to be switched to a telephone visit. With either a video or telephone visit, we are not always able to ensure that we have a secure connection.  By engaging in this virtual visit, you consent to the provision of healthcare and authorize for your insurance to be billed (if applicable) for the services provided during this visit. Depending on your insurance coverage, you may receive a charge related to this service.  I need to obtain your verbal consent now. Are you willing to proceed with your visit today? Eileen Palmer has provided verbal consent on 09/10/2023 for a virtual visit (video or telephone). Eileen Pion, NP  Date: 09/10/2023 9:36 AM   Virtual Visit via Video Note   I, Eileen Palmer, connected with  Eileen Palmer  ([id number], [birth year]) on 09/10/23 at  9:30 AM EDT by a video-enabled telemedicine application and verified that I am speaking with the correct person using two identifiers.  Location: Patient: Virtual Visit Location Patient:  Home Provider: Virtual Visit Location Provider: Home Office   I discussed the limitations of evaluation and management by telemedicine and the availability of in person appointments. The patient expressed understanding and agreed to proceed.    History of Present Illness: Eileen Palmer is a 23 y.o. who identifies as a female who was assigned female at birth, and is being seen today for feels a lump in the middle of the vagina area. Reports it is not like the bartholin cyst she had last year, because the location is in the middle. There is no tenderness. But it is hard and large. It does not have a Palmer or warmth or drainage. Denies fevers, chills, recent trauma, changes in sexual partners, pain in pelvic area or back pain. Recently treated for yeast infection.    Problems:  Patient Active Problem List   Diagnosis Date Noted   Constipation 06/11/2021   Abdominal pain, epigastric 06/11/2021    Allergies:  Allergies  Allergen Reactions   Lactose Intolerance (Gi) Diarrhea and Nausea Only   Medications:  Current Outpatient Medications:    doxycycline  (VIBRA -TABS) 100 MG tablet, Take 1 tablet (100 mg total) by mouth 2 (two) times daily., Disp: 20 tablet, Rfl: 0   famotidine  (PEPCID ) 20 MG tablet, Take 1 tablet (20 mg total) by mouth daily., Disp: 30 tablet, Rfl: 1   famotidine  (PEPCID ) 20 MG tablet, Take 1 tablet (20 mg total) by mouth 2 (two) times daily., Disp: 60 tablet, Rfl: 2   Semaglutide -Weight Management (WEGOVY ) 0.25  MG/0.5ML SOAJ, Inject 0.25 mg into the skin once a week., Disp: 2 mL, Rfl: 1  Observations/Objective: Patient is well-developed, well-nourished in no acute distress.  Resting comfortably  at home.  Palmer is normocephalic, atraumatic.  No labored breathing.  Speech is clear and coherent with logical content.  Patient is alert and oriented at baseline.    Assessment and Plan:   1. Lump in vagina (Primary)  Given location and symptoms advised that she needs in  person for full assessment to see if this is another cyst vs something else.   Patient acknowledged agreement and understanding of the plan.     Follow Up Instructions: I discussed the assessment and treatment plan with the patient. The patient was provided an opportunity to ask questions and all were answered. The patient agreed with the plan and demonstrated an understanding of the instructions.  A copy of instructions were sent to the patient via MyChart unless otherwise noted below.    The patient was advised to call back or seek an in-person evaluation if the symptoms worsen or if the condition fails to improve as anticipated.    Eileen Pion, NP

## 2023-09-21 ENCOUNTER — Other Ambulatory Visit (HOSPITAL_BASED_OUTPATIENT_CLINIC_OR_DEPARTMENT_OTHER): Payer: Self-pay

## 2024-02-24 ENCOUNTER — Telehealth (INDEPENDENT_AMBULATORY_CARE_PROVIDER_SITE_OTHER): Payer: Self-pay

## 2024-02-24 DIAGNOSIS — Z3490 Encounter for supervision of normal pregnancy, unspecified, unspecified trimester: Secondary | ICD-10-CM | POA: Insufficient documentation

## 2024-02-24 DIAGNOSIS — O3680X0 Pregnancy with inconclusive fetal viability, not applicable or unspecified: Secondary | ICD-10-CM

## 2024-02-24 DIAGNOSIS — Z6841 Body Mass Index (BMI) 40.0 and over, adult: Secondary | ICD-10-CM

## 2024-02-24 DIAGNOSIS — Z3A10 10 weeks gestation of pregnancy: Secondary | ICD-10-CM

## 2024-02-24 NOTE — Progress Notes (Signed)
 New OB Intake  I connected with Eileen Palmer  on 02/24/24 at  9:15 AM EST by telephone Video Visit and verified that I am speaking with the correct person using two identifiers. Nurse is located at [facility] and pt is located at Home.  I discussed the limitations, risks, security and privacy concerns of performing an evaluation and management service by telephone and the availability of in person appointments. I also discussed with the patient that there may be a patient responsible charge related to this service. The patient expressed understanding and agreed to proceed.  I explained I am completing New OB Intake today. We discussed EDD of 09/20/24 based on LMP of 12/15/23. Pt is G1P0000. I reviewed her allergies, medications and Medical/Surgical/OB history.    Patient Active Problem List   Diagnosis Date Noted   Encounter for supervision of low-risk pregnancy, antepartum 02/24/2024   Constipation 06/11/2021   Abdominal pain, epigastric 06/11/2021     Concerns addressed today Viability ultrasound scheduled for 03/02/24 at 10:55am.  Patient's husband reports he has sickle cell trait, she plans to have horizon test completed.    Delivery Plans Plans to deliver at [facility]. Discussed the nature of our practice with multiple providers including residents and students as well as female and female providers. Due to the size of the practice, the delivering provider may not be the same as those providing prenatal care.   Patient is interested in water birth.  MyChart/Babyscripts MyChart access verified. I explained pt will have some visits in office and some virtually. Babyscripts instructions given and order placed.   Blood Pressure Cuff/Weight Scale Patient states she plans to purchase a blood pressure cuff.  Explained after first prenatal appt pt will check weekly and document in Babyscripts. Patient does have weight scale.  Anatomy US  Explained first scheduled US  will be around 19 weeks.  Anatomy US  will be scheduled after viability ultrasound.  Is patient a CenteringPregnancy candidate?  Declined Declined due to Group setting  Is patient a Mom+Baby Combined Care candidate?  Declined     Is patient a candidate for Babyscripts Optimization? Yes, patient declined.   First visit review I reviewed new OB appt with patient. Explained pt will be seen by Regino, CNM at first visit. Discussed [place] genetic screening with patient. Patient desires [employer] and [employer].. Routine prenatal labs and genetic testing needed at [facility] OB visit.  Anatomy scan needs to be scheduled at viability ultrasound.   Last Pap Diagnosis  Date Value Ref Range Status  11/27/2022   Final   - Negative for intraepithelial lesion or malignancy (NILM)    Waddell LITTIE Burows, RN 02/24/2024  10:10 AM

## 2024-02-24 NOTE — Patient Instructions (Signed)

## 2024-03-02 ENCOUNTER — Other Ambulatory Visit: Payer: Self-pay | Admitting: Certified Nurse Midwife

## 2024-03-02 ENCOUNTER — Other Ambulatory Visit: Payer: Self-pay

## 2024-03-02 ENCOUNTER — Ambulatory Visit (INDEPENDENT_AMBULATORY_CARE_PROVIDER_SITE_OTHER): Payer: PRIVATE HEALTH INSURANCE

## 2024-03-02 DIAGNOSIS — Z6841 Body Mass Index (BMI) 40.0 and over, adult: Secondary | ICD-10-CM

## 2024-03-02 DIAGNOSIS — O3680X0 Pregnancy with inconclusive fetal viability, not applicable or unspecified: Secondary | ICD-10-CM

## 2024-03-02 DIAGNOSIS — Z3A10 10 weeks gestation of pregnancy: Secondary | ICD-10-CM

## 2024-03-02 DIAGNOSIS — Z3491 Encounter for supervision of normal pregnancy, unspecified, first trimester: Secondary | ICD-10-CM | POA: Diagnosis not present

## 2024-03-02 DIAGNOSIS — Z3490 Encounter for supervision of normal pregnancy, unspecified, unspecified trimester: Secondary | ICD-10-CM

## 2024-03-02 NOTE — Progress Notes (Signed)
 History:   Eileen Palmer is a 23 y.o. G1P0000 at [redacted]w[redacted]d by early ultrasound being seen today for her first obstetrical visit.  Her obstetrical history is significant for primigravida. Patient does intend to breast feed. Pregnancy history fully reviewed.  Patient reports no complaints.      HISTORY: OB History  Gravida Para Term Preterm AB Living  1 0 0 0 0 0  SAB IAB Ectopic Multiple Live Births  0 0 0 0 0    # Outcome Date GA Lbr Len/2nd Weight Sex Type Anes PTL Lv  1 Current             Last pap smear was done 8/24 and was normal  Past Medical History:  Diagnosis Date   Abdominal pain, epigastric 06/11/2021   Constipation 06/11/2021   GERD (gastroesophageal reflux disease)    No past surgical history on file. Family History  Problem Relation Age of Onset   Hyperlipidemia Mother    Diabetes Mother    Healthy Father    Social History   Tobacco Use   Smoking status: Never    Passive exposure: Never   Smokeless tobacco: Never  Vaping Use   Vaping status: Never Used  Substance Use Topics   Alcohol use: Not Currently   Drug use: Never   Allergies  Allergen Reactions   Lactose Intolerance (Gi) Diarrhea and Nausea Only   Current Outpatient Medications on File Prior to Visit  Medication Sig Dispense Refill   Prenatal Vit-Fe Fumarate-FA (PRENATAL PO) Take 1 mg of ticarcillin by mouth daily.     doxycycline  (VIBRA -TABS) 100 MG tablet Take 1 tablet (100 mg total) by mouth 2 (two) times daily. (Patient not taking: Reported on 03/04/2024) 20 tablet 0   famotidine  (PEPCID ) 20 MG tablet Take 1 tablet (20 mg total) by mouth daily. (Patient not taking: Reported on 02/24/2024) 30 tablet 1   famotidine  (PEPCID ) 20 MG tablet Take 1 tablet (20 mg total) by mouth 2 (two) times daily. 60 tablet 2   Ferrous Sulfate (IRON PO) Take 1 tablet by mouth daily. (Patient not taking: Reported on 03/04/2024)     No current facility-administered medications on file prior to visit.     Review of Systems Pertinent items noted in HPI and remainder of comprehensive ROS otherwise negative. Physical Exam:   Vitals:   03/04/24 1013  BP: (!) 115/101  Pulse: 77  Weight: 273 lb (123.8 kg)      Constitutional: Well-developed, well-nourished pregnant female in no acute distress.  HEENT: PERRLA Skin: normal color and turgor, no rash Cardiovascular: normal rate & rhythm, warm and well perfused Respiratory: normal effort, no problems with respiration noted GI: Abd soft, non-distended MS: Extremities nontender, no edema, normal ROM Neurologic: Alert and oriented x 4.  GU: no CVA tenderness Pelvic: Exam deferred  Assessment:    Pregnancy: G1P0000 Patient Active Problem List   Diagnosis Date Noted   Encounter for supervision of low-risk pregnancy, antepartum 02/24/2024     Plan:    1. Encounter for supervision of low-risk pregnancy, antepartum (Primary) - Doing well, not yet perceiving fetal movmenet.  - FHR obtained with BSUS, due to difficulty with doppler.  - Pregnancy accommodations work note provided to patient, works in [employer].   2. [redacted] weeks gestation of pregnancy - Routine PNC, anticipatory guidance.  - 7 day difference between US  results and LMP, initially dated at 11+ weeks. Panorama next visit. - Elevated BP x2, no cuffs, suspect potential chronic hypertension. No  BP cuffs stocked in office, will have patient return next week for BP check and cuff.  - bASA sent to start at 12 weeks.    - Initial labs drawn. - Continue prenatal vitamins. - Problem list reviewed and updated. - Genetic Screening discussed, First trimester screen, Quad screen, and NIPS: requested. - Ultrasound discussed; fetal anatomic survey: requested. - Anticipatory guidance about prenatal visits given including labs, ultrasounds, and testing. - Discussed usage of Babyscripts and virtual visits as additional source of managing and completing prenatal visits in midst of coronavirus  and pandemic.   - Encouraged to complete MyChart Registration for her ability to review results, send requests, and have questions addressed.  - The nature of [facility] with multiple MDs and Advanced Practice Providers was explained to patient; also emphasized that residents, students are part of our team. - Routine obstetric precautions reviewed. Encouraged to seek out care at office or emergency room [facility] preferred) for urgent and/or emergent concerns.  Return in about 4 weeks (around 04/01/2024) for LOB.    Future Appointments  Date Time Provider Department Center  03/14/2024 10:00 AM [facility] NURSE [facility]  03/30/2024  9:15 AM Zina Jerilynn LABOR, MD [facility]     Camie Rote, MSN, CNM, RNC-OB Certified Nurse Midwife, [facility] 03/04/2024 4:53 PM

## 2024-03-04 ENCOUNTER — Encounter: Payer: Self-pay | Admitting: Certified Nurse Midwife

## 2024-03-04 ENCOUNTER — Ambulatory Visit (INDEPENDENT_AMBULATORY_CARE_PROVIDER_SITE_OTHER): Payer: PRIVATE HEALTH INSURANCE | Admitting: Certified Nurse Midwife

## 2024-03-04 ENCOUNTER — Other Ambulatory Visit (HOSPITAL_COMMUNITY)
Admission: RE | Admit: 2024-03-04 | Discharge: 2024-03-04 | Disposition: A | Discharge status: Home / Self Care | Payer: PRIVATE HEALTH INSURANCE | Source: Ambulatory Visit | Attending: Certified Nurse Midwife | Admitting: Certified Nurse Midwife

## 2024-03-04 ENCOUNTER — Other Ambulatory Visit: Payer: Self-pay

## 2024-03-04 VITALS — BP 115/101 | HR 77 | Wt 273.0 lb

## 2024-03-04 DIAGNOSIS — Z3A11 11 weeks gestation of pregnancy: Secondary | ICD-10-CM

## 2024-03-04 DIAGNOSIS — Z3490 Encounter for supervision of normal pregnancy, unspecified, unspecified trimester: Secondary | ICD-10-CM

## 2024-03-04 DIAGNOSIS — Z3401 Encounter for supervision of normal first pregnancy, first trimester: Secondary | ICD-10-CM

## 2024-03-04 DIAGNOSIS — Z3A10 10 weeks gestation of pregnancy: Secondary | ICD-10-CM | POA: Diagnosis not present

## 2024-03-04 DIAGNOSIS — Z3400 Encounter for supervision of normal first pregnancy, unspecified trimester: Secondary | ICD-10-CM | POA: Insufficient documentation

## 2024-03-04 MED ORDER — ASPIRIN 81 MG PO TBEC: 81.0000 mg | DELAYED_RELEASE_TABLET | Freq: Every day | ORAL | 3 refills | Status: AC

## 2024-03-04 NOTE — Patient Instructions (Signed)
 Magnesium in pregnancy  Magnesium is a natural electrolyte we find in our foods and beverages. It is beneficial in pregnancy for multiple complaints.  It can help with headaches, rest, muscle aches/spasm, and bowel movements.  You may take Magnesium Glycinate or Magnesium Oxide (available at drug stores and online). We recommend starting with one tablet and working up to two. Take Magnesium nightly at bedtime to promote rest.   Safe Medications in Pregnancy   Acne:  Benzoyl Peroxide  Salicylic Acid   Backache/Headache:  Tylenol : 2 regular strength every 4 hours OR               2 Extra strength every 6 hours   Colds/Coughs/Allergies:  Benadryl (alcohol free) 25 mg every 6 hours as needed  Breath right strips  Claritin  Cepacol throat lozenges  Chloraseptic throat spray  Cold-Eeze- up to three times per day  Cough drops, alcohol free  Flonase (by prescription only)  Guaifenesin  Mucinex  Robitussin DM (plain only, alcohol free)  Saline nasal spray/drops  Sudafed (pseudoephedrine) & Actifed * use only after [redacted] weeks gestation and if you do not have high blood pressure  Tylenol   Vicks Vaporub  Zinc lozenges  Zyrtec    Constipation:  Colace  Ducolax suppositories  Fleet enema  Glycerin suppositories  Metamucil  Milk of magnesia  Miralax  Senokot  Smooth move tea   Diarrhea:  Kaopectate  Imodium A-D   *NO pepto Bismol   Hemorrhoids:  Anusol  Anusol HC  Preparation H  Tucks   Indigestion:  Tums  Maalox  Mylanta  Zantac  Pepcid    Insomnia:  Benadryl (alcohol free) 25mg  every 6 hours as needed (last resort)  Tylenol  PM  Unisom, no Gelcaps   Leg Cramps:  Tums  MagGel   Nausea/Vomiting:  Bonine  Dramamine  Emetrol  Ginger extract  Sea bands  Meclizine  Nausea medication to take during pregnancy:  Unisom (doxylamine succinate 25 mg tablets) Take one tablet daily at bedtime. If symptoms are not adequately controlled, the dose can be increased to  a maximum recommended dose of two tablets daily (1/2 tablet in the morning, 1/2 tablet mid-afternoon and one at bedtime).  Vitamin B6 100mg  tablets. Take one tablet twice a day (up to 200 mg per day).   Skin Rashes:  Aveeno products  Benadryl cream or 25mg  every 6 hours as needed  Calamine Lotion  1% cortisone cream   Yeast infection:  Gyne-lotrimin 7  Monistat 7    **If taking multiple medications, please check labels to avoid duplicating the same active ingredients  **take medication as directed on the label  ** Do not exceed 4000 mg of tylenol  in 24 hours  **Do not take medications that contain aspirin  or ibuprofen 

## 2024-03-05 LAB — CBC/D/PLT+RPR+RH+ABO+RUBIGG...
Antibody Screen: NEGATIVE
Basophils Absolute: 0 x10E3/uL (ref 0.0–0.2)
Basos: 0 %
EOS (ABSOLUTE): 0.1 x10E3/uL (ref 0.0–0.4)
Eos: 1 %
HCV Ab: NONREACTIVE
HIV Screen 4th Generation wRfx: NONREACTIVE
Hematocrit: 36.2 % (ref 34.0–46.6)
Hemoglobin: 12 g/dL (ref 11.1–15.9)
Hepatitis B Surface Ag: NEGATIVE
Immature Grans (Abs): 0 x10E3/uL (ref 0.0–0.1)
Immature Granulocytes: 0 %
Lymphocytes Absolute: 2.1 x10E3/uL (ref 0.7–3.1)
Lymphs: 44 %
MCH: 29.4 pg (ref 26.6–33.0)
MCHC: 33.1 g/dL (ref 31.5–35.7)
MCV: 89 fL (ref 79–97)
Monocytes Absolute: 0.5 x10E3/uL (ref 0.1–0.9)
Monocytes: 10 %
Neutrophils Absolute: 2.1 x10E3/uL (ref 1.4–7.0)
Neutrophils: 45 %
Platelets: 275 x10E3/uL (ref 150–450)
RBC: 4.08 x10E6/uL (ref 3.77–5.28)
RDW: 12.9 % (ref 11.7–15.4)
RPR Ser Ql: NONREACTIVE
Rh Factor: POSITIVE
Rubella Antibodies, IGG: 11 {index} (ref >0.99)
WBC: 4.7 x10E3/uL (ref 3.4–10.8)

## 2024-03-05 LAB — HEMOGLOBIN A1C
Est. average glucose Bld gHb Est-mCnc: 103 mg/dL
Hgb A1c MFr Bld: 5.2 % (ref 4.8–5.6)

## 2024-03-05 LAB — VITAMIN D 25 HYDROXY (VIT D DEFICIENCY, FRACTURES): Vit D, 25-Hydroxy: 20.1 ng/mL — ABNORMAL LOW (ref 30.0–100.0)

## 2024-03-05 LAB — HCV INTERPRETATION

## 2024-03-06 LAB — URINE CULTURE, OB REFLEX

## 2024-03-06 LAB — CULTURE, OB URINE

## 2024-03-07 ENCOUNTER — Other Ambulatory Visit (HOSPITAL_BASED_OUTPATIENT_CLINIC_OR_DEPARTMENT_OTHER): Payer: Self-pay

## 2024-03-07 ENCOUNTER — Ambulatory Visit: Payer: Self-pay | Admitting: Certified Nurse Midwife

## 2024-03-07 DIAGNOSIS — B9689 Other specified bacterial agents as the cause of diseases classified elsewhere: Secondary | ICD-10-CM

## 2024-03-07 DIAGNOSIS — B3731 Acute candidiasis of vulva and vagina: Secondary | ICD-10-CM

## 2024-03-07 LAB — CERVICOVAGINAL ANCILLARY ONLY
Bacterial Vaginitis (gardnerella): POSITIVE — AB
Candida Glabrata: NEGATIVE
Candida Vaginitis: POSITIVE — AB
Chlamydia: NEGATIVE
Comment: NEGATIVE
Comment: NEGATIVE
Comment: NEGATIVE
Comment: NEGATIVE
Comment: NEGATIVE
Comment: NORMAL
Neisseria Gonorrhea: NEGATIVE
Trichomonas: NEGATIVE

## 2024-03-07 MED ORDER — FLUCONAZOLE 150 MG PO TABS
150.0000 mg | ORAL_TABLET | Freq: Once | ORAL | 0 refills | Status: AC
  Filled 2024-03-07: qty 1, 1d supply, fill #0

## 2024-03-07 MED ORDER — METRONIDAZOLE 0.75 % VA GEL
1.0000 | Freq: Every day | VAGINAL | 1 refills | Status: AC
  Filled 2024-03-07: qty 70, 7d supply, fill #0

## 2024-03-14 ENCOUNTER — Ambulatory Visit: Payer: PRIVATE HEALTH INSURANCE

## 2024-03-14 ENCOUNTER — Other Ambulatory Visit: Payer: Self-pay

## 2024-03-14 VITALS — BP 121/73 | HR 73 | Ht 65.5 in | Wt 274.0 lb

## 2024-03-14 DIAGNOSIS — Z0130 Encounter for examination of blood pressure without abnormal findings: Secondary | ICD-10-CM

## 2024-03-14 NOTE — Progress Notes (Signed)
 Subjective:  Eileen Palmer is a G1P0000 here for BP check by request of provider from [facility] visit on 03/04/24.   Hypertension ROS: Patient denies visual disturbances and headache.      Objective:  LMP 12/15/2023 (Exact Date)   Appearance alert, well appearing, and in no distress.  Assessment:   Blood Pressure stable.   Plan:  Current treatment plan is effective, no change in therapy..   Sedric Guia, RN  03/14/24

## 2024-03-15 LAB — PANORAMA PRENATAL TEST FULL PANEL:PANORAMA TEST PLUS 5 ADDITIONAL MICRODELETIONS: FETAL FRACTION: 4.2

## 2024-03-16 LAB — HORIZON CUSTOM: REPORT SUMMARY: NEGATIVE

## 2024-03-16 NOTE — Progress Notes (Signed)
 Normal [employer] and [employer].   Camie Rote, MSN, CNM, RNC-OB Certified Nurse Midwife, [facility] 03/16/2024 4:15 PM

## 2024-03-19 ENCOUNTER — Other Ambulatory Visit (HOSPITAL_COMMUNITY): Payer: Self-pay

## 2024-03-21 ENCOUNTER — Other Ambulatory Visit (HOSPITAL_COMMUNITY): Payer: Self-pay

## 2024-03-30 ENCOUNTER — Ambulatory Visit (INDEPENDENT_AMBULATORY_CARE_PROVIDER_SITE_OTHER): Payer: PRIVATE HEALTH INSURANCE | Admitting: Family Medicine

## 2024-03-30 ENCOUNTER — Other Ambulatory Visit: Payer: Self-pay

## 2024-03-30 VITALS — BP 126/81 | HR 80 | Wt 276.5 lb

## 2024-03-30 DIAGNOSIS — Z3A14 14 weeks gestation of pregnancy: Secondary | ICD-10-CM | POA: Diagnosis not present

## 2024-03-30 DIAGNOSIS — Z3490 Encounter for supervision of normal pregnancy, unspecified, unspecified trimester: Secondary | ICD-10-CM

## 2024-03-30 DIAGNOSIS — Z3492 Encounter for supervision of normal pregnancy, unspecified, second trimester: Secondary | ICD-10-CM

## 2024-03-30 NOTE — Progress Notes (Signed)
° °  PRENATAL VISIT NOTE  Subjective:  Eileen Palmer is a 23 y.o. G1P0000 at [redacted]w[redacted]d being seen today for ongoing prenatal care.  She is currently monitored for the following issues for this low-risk pregnancy and has Encounter for supervision of low-risk pregnancy, antepartum and Primigravida on their problem list.  Patient reports no bleeding, no contractions, no cramping, and no leaking.  Contractions: Not present. Vag. Bleeding: None.  Movement: Absent. Denies leaking of fluid.   The following portions of the patient's history were reviewed and updated as appropriate: allergies, current medications, past family history, past medical history, past social history, past surgical history and problem list.   Objective:   Vitals:   03/30/24 1621  BP: 126/81  Pulse: 80  Weight: 276 lb 8 oz (125.4 kg)    Fetal Status:  Fetal Heart Rate (bpm): 157   Movement: Absent    General: Alert, oriented and cooperative. Patient is in no acute distress.  Skin: Skin is warm and dry. No rash noted.   Cardiovascular: Normal heart rate noted  Respiratory: Normal respiratory effort, no problems with respiration noted  Abdomen: Soft, gravid, appropriate for gestational age.  Pain/Pressure: Absent (abdominal pressure and lower back pain scale  4)     Pelvic: Cervical exam deferred        Extremities: Normal range of motion.  Edema: None  Mental Status: Normal mood and affect. Normal behavior. Normal judgment and thought content.      03/04/2024   12:34 PM 03/26/2023    5:05 PM 11/27/2022    9:11 AM  Depression screen PHQ 2/9  Decreased Interest 2 0 2  Down, Depressed, Hopeless 1 0 0  PHQ - 2 Score 3 0 2  Altered sleeping 3  2  Tired, decreased energy 3  1  Change in appetite 2  2  Feeling bad or failure about yourself  0  0  Trouble concentrating 0  0  Moving slowly or fidgety/restless 1  0  Suicidal thoughts 0  0  PHQ-9 Score 12  7      Data saved with a previous flowsheet row definition         03/04/2024   12:34 PM 11/27/2022    9:11 AM 09/23/2022    9:55 AM 01/01/2018    9:52 AM  GAD 7 : Generalized Anxiety Score  Nervous, Anxious, on Edge 2 0 1 0  Control/stop worrying 1 0 2 1  Worry too much - different things 2 0 1 1  Trouble relaxing 1 0 0 0  Restless 0 0 0 0  Easily annoyed or irritable 2 0 1 0  Afraid - awful might happen 1 0 0 0  Total GAD 7 Score 9 0 5 2    Assessment and Plan:  Pregnancy: G1P0000 at [redacted]w[redacted]d 1. Encounter for supervision of low-risk pregnancy, antepartum (Primary) FHR BP appropriate today On ASA daily On prenatal vitamin and vitamin D  supplementation  2. [redacted] weeks gestation of pregnancy   Preterm labor symptoms and general obstetric precautions including but not limited to vaginal bleeding, contractions, leaking of fluid and fetal movement were reviewed in detail with the patient. Please refer to After Visit Summary for other counseling recommendations.   No follow-ups on file.  Future Appointments  Date Time Provider Department Center  05/24/2024  9:00 AM [facility] PROVIDER 1 [facility]  05/24/2024  9:30 AM [facility]    Norleen LULLA Rover, MD

## 2024-04-28 ENCOUNTER — Encounter: Payer: Self-pay | Admitting: Obstetrics and Gynecology

## 2024-04-28 ENCOUNTER — Other Ambulatory Visit: Payer: Self-pay

## 2024-04-28 ENCOUNTER — Ambulatory Visit: Payer: PRIVATE HEALTH INSURANCE | Admitting: Obstetrics and Gynecology

## 2024-04-28 VITALS — BP 132/85 | HR 73 | Wt 277.0 lb

## 2024-04-28 DIAGNOSIS — O99212 Obesity complicating pregnancy, second trimester: Secondary | ICD-10-CM | POA: Diagnosis not present

## 2024-04-28 DIAGNOSIS — Z3490 Encounter for supervision of normal pregnancy, unspecified, unspecified trimester: Secondary | ICD-10-CM

## 2024-04-28 DIAGNOSIS — Z6841 Body Mass Index (BMI) 40.0 and over, adult: Secondary | ICD-10-CM

## 2024-04-28 DIAGNOSIS — Z3A18 18 weeks gestation of pregnancy: Secondary | ICD-10-CM | POA: Diagnosis not present

## 2024-04-28 DIAGNOSIS — O99210 Obesity complicating pregnancy, unspecified trimester: Secondary | ICD-10-CM

## 2024-04-28 DIAGNOSIS — O139 Gestational [pregnancy-induced] hypertension without significant proteinuria, unspecified trimester: Secondary | ICD-10-CM | POA: Insufficient documentation

## 2024-04-28 NOTE — Progress Notes (Signed)
 "  PRENATAL VISIT NOTE  Subjective:  Eileen Palmer is a 24 y.o. G1P0000 at [redacted]w[redacted]d being seen today for ongoing prenatal care.  She is currently monitored for the following issues for this low-risk pregnancy and has Encounter for supervision of low-risk pregnancy, antepartum; BMI 40.0-44.9, adult (HCC); Obesity in pregnancy; and Transient hypertension of pregnancy on their problem list.  Patient reports no complaints.  Contractions: Not present. Vag. Bleeding: None.  Movement: Absent. Denies leaking of fluid.   The following portions of the patient's history were reviewed and updated as appropriate: allergies, current medications, past family history, past medical history, past social history, past surgical history and problem list.   Objective:   Vitals:   04/28/24 1057  BP: 132/85  Pulse: 73  Weight: 277 lb (125.6 kg)    Fetal Status:  Fetal Heart Rate (bpm): 145   Movement: Absent    General: Alert, oriented and cooperative. Patient is in no acute distress.  Skin: Skin is warm and dry. No rash noted.   Cardiovascular: Normal heart rate noted  Respiratory: Normal respiratory effort, no problems with respiration noted  Abdomen: Soft, gravid, appropriate for gestational age.  Pain/Pressure: Absent     Pelvic: Cervical exam deferred        Extremities: Normal range of motion.  Edema: None  Mental Status: Normal mood and affect. Normal behavior. Normal judgment and thought content.      03/04/2024   12:34 PM 03/26/2023    5:05 PM 11/27/2022    9:11 AM  Depression screen PHQ 2/9  Decreased Interest 2 0 2  Down, Depressed, Hopeless 1 0 0  PHQ - 2 Score 3 0 2  Altered sleeping 3  2  Tired, decreased energy 3  1  Change in appetite 2  2  Feeling bad or failure about yourself  0  0  Trouble concentrating 0  0  Moving slowly or fidgety/restless 1  0  Suicidal thoughts 0  0  PHQ-9 Score 12  7      Data saved with a previous flowsheet row definition        03/04/2024   12:34  PM 11/27/2022    9:11 AM 09/23/2022    9:55 AM 01/01/2018    9:52 AM  GAD 7 : Generalized Anxiety Score  Nervous, Anxious, on Edge 2 0 1 0  Control/stop worrying 1 0 2 1  Worry too much - different things 2 0 1 1  Trouble relaxing 1 0 0 0  Restless 0 0 0 0  Easily annoyed or irritable 2 0 1 0  Afraid - awful might happen 1 0 0 0  Total GAD 7 Score 9 0 5 2    Assessment and Plan:  Pregnancy: G1P0000 at [redacted]w[redacted]d 1. [redacted] weeks gestation of pregnancy (Primary) Follow up anatomy u/s - AFP, Serum, Open Spina Bifida  2. Encounter for supervision of low-risk pregnancy, antepartum Pt desires afp. Pt states she's already on supplemental vitamin d . I told her to get about 3000u per day total - AFP, Serum, Open Spina Bifida  3. BMI 40.0-44.9, adult (HCC) Will check baseline tsh.  Weight stable - AFP, Serum, Open Spina Bifida - TSH Rfx on Abnormal to Free T4  4. Obesity in pregnancy - AFP, Serum, Open Spina Bifida - TSH Rfx on Abnormal to Free T4  Preterm labor symptoms and general obstetric precautions including but not limited to vaginal bleeding, contractions, leaking of fluid and fetal movement were reviewed in detail  with the patient. Please refer to After Visit Summary for other counseling recommendations.   Return in about 1 month (around 05/29/2024) for in person, low risk ob, md or app.  Future Appointments  Date Time Provider Department Center  05/24/2024  9:00 AM [facility] PROVIDER 1 [facility]  05/24/2024  9:30 AM [facility]    Bebe Furry, MD  "

## 2024-04-30 LAB — AFP, SERUM, OPEN SPINA BIFIDA
AFP MoM: 0.46
AFP Value: 15.5 ng/mL
Gest. Age on Collection Date: 18.2 wk
Maternal Age At EDD: 25.4 a
OSBR Risk 1 IN: 10000
Test Results:: NEGATIVE
Weight: 277 [lb_av]

## 2024-04-30 LAB — TSH RFX ON ABNORMAL TO FREE T4: TSH: 1.96 u[IU]/mL (ref 0.450–4.500)

## 2024-05-24 ENCOUNTER — Ambulatory Visit: Payer: Self-pay

## 2024-05-30 ENCOUNTER — Encounter: Payer: Self-pay | Admitting: Obstetrics and Gynecology

## 2024-09-27 ENCOUNTER — Inpatient Hospital Stay (HOSPITAL_COMMUNITY): Admit: 2024-09-27 | Payer: PRIVATE HEALTH INSURANCE
